# Patient Record
Sex: Female | Born: 1950 | Race: White | Hispanic: No | Marital: Married | Smoking: Former smoker
Health system: Southern US, Community
[De-identification: ages and names within clinical notes are randomized; demographics above are authoritative.]

## PROBLEM LIST (undated history)

## (undated) DIAGNOSIS — F419 Anxiety disorder, unspecified: Secondary | ICD-10-CM

## (undated) DIAGNOSIS — S83209A Unspecified tear of unspecified meniscus, current injury, unspecified knee, initial encounter: Secondary | ICD-10-CM

## (undated) HISTORY — PX: BREAST REDUCTION SURGERY: SHX8

## (undated) HISTORY — PX: TONSILLECTOMY: SUR1361

## (undated) HISTORY — PX: OTHER SURGICAL HISTORY: SHX169

## (undated) HISTORY — PX: BONE FLAP RECONSTRUCTION SKULL: SHX5176

---

## 1998-02-13 ENCOUNTER — Other Ambulatory Visit: Admission: RE | Admit: 1998-02-13 | Discharge: 1998-02-13 | Payer: Self-pay | Admitting: Obstetrics & Gynecology

## 1999-02-17 ENCOUNTER — Other Ambulatory Visit: Admission: RE | Admit: 1999-02-17 | Discharge: 1999-02-17 | Payer: Self-pay | Admitting: Obstetrics & Gynecology

## 1999-02-18 ENCOUNTER — Other Ambulatory Visit: Admission: RE | Admit: 1999-02-18 | Discharge: 1999-02-18 | Payer: Self-pay | Admitting: Obstetrics & Gynecology

## 1999-02-21 ENCOUNTER — Inpatient Hospital Stay (HOSPITAL_COMMUNITY): Admission: AD | Admit: 1999-02-21 | Discharge: 1999-02-21 | Payer: Self-pay | Admitting: Obstetrics & Gynecology

## 2000-02-18 ENCOUNTER — Other Ambulatory Visit: Admission: RE | Admit: 2000-02-18 | Discharge: 2000-02-18 | Payer: Self-pay | Admitting: Obstetrics & Gynecology

## 2000-03-03 ENCOUNTER — Other Ambulatory Visit: Admission: RE | Admit: 2000-03-03 | Discharge: 2000-03-03 | Payer: Self-pay | Admitting: Plastic Surgery

## 2001-02-03 ENCOUNTER — Other Ambulatory Visit: Admission: RE | Admit: 2001-02-03 | Discharge: 2001-02-03 | Payer: Self-pay | Admitting: Obstetrics & Gynecology

## 2002-02-26 ENCOUNTER — Other Ambulatory Visit: Admission: RE | Admit: 2002-02-26 | Discharge: 2002-02-26 | Payer: Self-pay | Admitting: Obstetrics & Gynecology

## 2002-08-10 ENCOUNTER — Ambulatory Visit (HOSPITAL_COMMUNITY): Admission: RE | Admit: 2002-08-10 | Discharge: 2002-08-10 | Payer: Self-pay | Admitting: Gastroenterology

## 2003-03-04 ENCOUNTER — Other Ambulatory Visit: Admission: RE | Admit: 2003-03-04 | Discharge: 2003-03-04 | Payer: Self-pay | Admitting: Obstetrics & Gynecology

## 2004-03-18 ENCOUNTER — Other Ambulatory Visit: Admission: RE | Admit: 2004-03-18 | Discharge: 2004-03-18 | Payer: Self-pay | Admitting: Obstetrics & Gynecology

## 2005-03-29 ENCOUNTER — Other Ambulatory Visit: Admission: RE | Admit: 2005-03-29 | Discharge: 2005-03-29 | Payer: Self-pay | Admitting: Obstetrics & Gynecology

## 2009-04-28 ENCOUNTER — Observation Stay (HOSPITAL_COMMUNITY): Admission: EM | Admit: 2009-04-28 | Discharge: 2009-05-01 | Payer: Self-pay | Admitting: Emergency Medicine

## 2010-12-27 LAB — URINALYSIS, ROUTINE W REFLEX MICROSCOPIC
Glucose, UA: NEGATIVE mg/dL
Ketones, ur: 15 mg/dL — AB
Nitrite: NEGATIVE
pH: 5.5 (ref 5.0–8.0)

## 2010-12-27 LAB — BASIC METABOLIC PANEL
BUN: 11 mg/dL (ref 6–23)
Calcium: 9.1 mg/dL (ref 8.4–10.5)
Chloride: 106 mEq/L (ref 96–112)
GFR calc Af Amer: >60 mL/min (ref >60)
GFR calc non Af Amer: >60 mL/min (ref >60)
Glucose, Bld: 145 mg/dL — ABNORMAL HIGH (ref 70–99)
Potassium: 4.1 mEq/L (ref 3.5–5.1)
Sodium: 134 mEq/L — ABNORMAL LOW (ref 135–145)
Sodium: 138 mEq/L (ref 135–145)

## 2010-12-27 LAB — CBC
HCT: 37.9 % (ref 36.0–46.0)
Hemoglobin: 12.9 g/dL (ref 12.0–15.0)
Hemoglobin: 16.1 g/dL — ABNORMAL HIGH (ref 12.0–15.0)
MCV: 92.2 fL (ref 78.0–100.0)
Platelets: 222 10*3/uL (ref 150–400)
RBC: 5 MIL/uL (ref 3.87–5.11)
WBC: 5 10*3/uL (ref 4.0–10.5)
WBC: 5.1 10*3/uL (ref 4.0–10.5)

## 2010-12-27 LAB — DIFFERENTIAL
Lymphocytes Relative: 21 % (ref 12–46)
Monocytes Absolute: 0.1 10*3/uL (ref 0.1–1.0)
Monocytes Relative: 3 % (ref 3–12)
Neutro Abs: 3.8 10*3/uL (ref 1.7–7.7)

## 2010-12-27 LAB — URINE MICROSCOPIC-ADD ON

## 2011-02-02 NOTE — Discharge Summary (Signed)
Linda Braun, Linda Braun               ACCOUNT NO.:  [id number]   MEDICAL RECORD NO.:  [id number]          PATIENT TYPE:  OBV   LOCATION:  3041                         FACILITY:  [facility]   PHYSICIAN:  Hillery Aldo, M.D.   DATE OF BIRTH:  Oct 03, 1950   DATE OF ADMISSION:  04/28/2009  DATE OF DISCHARGE:  05/01/2009                               DISCHARGE SUMMARY   PRIMARY CARE PHYSICIAN:  Erskine Speed, MD   DISCHARGE DIAGNOSES:  1. Anaphylactic reaction with hypotension.  2. Hives/urticaria secondary to an adverse reaction to either Cipro or      Macrobid.  3. Dehydration with orthostasis.  4. Anxiety.  5. Constipation.  6. Gastroesophageal reflux disease.   DISCHARGE MEDICATIONS:  1. Prednisone 60 mg on May 01, 2009, taper by 10 mg daily until      off.  2. Pepcid 20 mg p.o. q.12 h. x1 week.  3. Klonopin 0.25 mg p.o. nightly p.r.n.  4. Prilosec 20 mg daily p.o. p.r.n. dyspepsia.  5. Zyrtec 10 mg p.o. daily x1 week.  6. Epipen use as directed for current allergic symptoms involving      swelling about the mouth or throat.   CONSULTATIONS:  None.   BRIEF ADMISSION HISTORY OF PRESENT ILLNESS:  The patient is a 60-year-  old female who was recently treated for urinary tract infection with a 7-  day course of Macrobid followed by 2 doses of Cipro after her symptoms  failed to resolve.  Immediately after this, the patient began to break  out in a rash.  She visited an out-of-town urgent care center and was  given a tapering dose of prednisone as well as some Benadryl.  Symptoms  persisted and she subsequently presented to her primary care physician's  office for evaluation.  While at her physician's office, she developed  lightheadedness and was noted to be hypotensive and therefore sent to  the hospital for further evaluation and treatment.  For full details,  please see the dictated report done by Dr. Ardyth Harps.   PROCEDURES AND DIAGNOSTIC STUDIES:  None.   DISCHARGE  LABORATORY VALUES:  Chemistries were unremarkable with the  exception of mildly elevated glucose of 145.  CBC was completely  unremarkable.  Urinalysis was benign.   HOSPITAL COURSE:  1. Hives/urticaria secondary to an adverse reaction to either Cipro      and Macrobid:  The patient was put on high-dose IV Solu-Medrol,      Pepcid, and Benadryl.  She had significant anxiety regarding her      skin symptoms and had to be reassured by the nursing staff as well      as her treating physicians that this was a temporary condition.  At      this point, the patient's hives are dramatically better and her      steroids have been tapered.  She will be discharged on an      additional 6 days of treatment with prednisone on a tapering dose.  2. Dehydration/orthostasis:  The patient received IV fluids and her      blood pressure  has been stable with no recurrent symptoms.  3. Anxiety:  The patient was provided with Klonopin as needed.  4. Constipation:  The patient responded to MiraLax.  5. Gastroesophageal reflux disease:  The patient can continue Prilosec      OTC as needed.   DISPOSITION:  The patient is medically stable and will be discharged  home.  She has been written a prescription for an Epipen so that if she  develops an allergic reaction in the future, she can self administer  epinephrine for any development of angioedema or throat swelling.   CONDITION ON DISCHARGE:  Improved.   Time spent in coordinating care for discharge and discharge instructions  equals to 35 minutes.      Hillery Aldo, M.D.  Electronically Signed     CR/MEDQ  D:  05/01/2009  T:  05/02/2009  Job:  [id number]   cc:   Erskine Speed, M.D.

## 2011-02-02 NOTE — H&P (Signed)
NAME:  Braun, Linda               ACCOUNT NO.:  [id number]   MEDICAL RECORD NO.:  [id number]          PATIENT TYPE:  EMS   LOCATION:  MAJO                         FACILITY:  [facility]   PHYSICIAN:  Peggye Pitt, M.D. DATE OF BIRTH:  27-Dec-1950   DATE OF ADMISSION:  04/28/2009  DATE OF DISCHARGE:                              HISTORY & PHYSICAL   PRIMARY CARE PHYSICIAN:  Dr. Elmore Guise   CHIEF COMPLAINT:  Hives/lightheadedness.   HISTORY OF PRESENT ILLNESS:  Linda Braun is a very pleasant, 60-year-  old Caucasian lady who has no significant past medical history, who on a  July 26th was diagnosed with a urinary tract infection and given a 7-day  course of Macrobid which she completed without any problems.  However,  because of continued symptoms, she was phoned in a prescription for  Cipro of which she took a total of 2 pills.  Immediately afterwards, she  started breaking out in a rash and had some swollen joints.  Meanwhile,  she had been to [facility] to visit her daughter, so she went to an urgent  care center there and was given a tapering dose of prednisone as well as  some Benadryl.  However, symptoms persisted for which she went to Dr.  Thomasene Lot office today.  While at the office, she was a lightheaded and  had signs of orthostasis, and she was told to come into the hospital for  further evaluation and management.  Linda Braun has not had a swollen  tongue or lips.  She has not had any difficulty swallowing or breathing.  No fevers or chills.  No abdominal pain.   ALLERGIES:  Now allergic to CIPRO; otherwise unknown.   PAST MEDICAL HISTORY:  Nonsignificant; acoustic neuroma.   Her home medications include:  Klonopin 2.5 to 5 mg at bedtime as  needed.   SOCIAL HISTORY:  She denies any illicit drug use or tobacco abuse.  However, she does drink 2 glasses of wine maybe 4 times a week.   FAMILY HISTORY:  She is married her husband, Cleone Slim, is present at time of  examination.   FAMILY HISTORY:  Nonsignificant.   REVIEW OF SYSTEMS:  Negative except as already mentioned in HPI.   PHYSICAL EXAM:  VITAL SIGNS: Upon admission blood pressure 130/79, heart  rate 116, respirations 16, O2 sats 99% on room air with a temperature of  99.1.  GENERAL:  She is currently alert, awake, oriented x3, is very drowsy.  HEENT:  Normocephalic, atraumatic.  Her pupils are equally reactive to  light and accommodation with intact extraocular movements.  She has  extremely dry mucous membranes.  CARDIOVASCULAR:  She is tachycardiac with a regular rhythm.  No murmurs,  rubs, or gallops were auscultated.  LUNGS:  Clear to auscultation bilaterally.  ABDOMEN:  Soft, nontender, nondistended, with positive bowel sounds.  EXTREMITIES:  She has some trace bilateral pitting edema with no  clubbing or cyanosis.  NEUROLOGIC:  Exam is grossly intact and nonfocal.  SKIN:  She has diffuse whelps and erythema all over her trunk, face,  eyelids, arms, and thighs.  The only area on her body that seems to be  spared are her calves and feet.   LABS UPON ADMISSION:  Sodium 134, potassium 3.9, chloride 101, bicarb  26, BUN 12, creatinine 0.73 with a glucose of 157.  WBCs 5.0, hemoglobin  16.1, and a platelet count of 274.   ASSESSMENT AND PLAN:  1. Allergic reaction to Cipro.  Currently, she does not to appear to      have any airway compromise.  We will admit to telemetry for 23-hour      observation.  We will start her on IV steroids, H1 and H2 blockers.      I will also give her topical Benadryl as needed for comfort.  2. Dehydration as evidenced by her poor skin turgor and decreased      mucous membranes as well as by her hemoconcentration with a      hemoglobin of 16.11.  We will start her on IV fluids at 100 mL an      hour and check orthostatic vital signs.  Per Dr. Thomasene Lot note, she      was orthostatic in his office; however I do not have the      orthostatic vital signs with me at this  time.  3. Prophylaxis.  She will be on Lovenox for DVT prophylaxis and on      Pepcid for GI prophylaxis.      Peggye Pitt, M.D.  Electronically Signed     EH/MEDQ  D:  04/28/2009  T:  04/28/2009  Job:  [id number]

## 2011-05-17 ENCOUNTER — Inpatient Hospital Stay (HOSPITAL_COMMUNITY): Admit: 2011-05-17 | Payer: Self-pay | Admitting: Orthopedic Surgery

## 2011-10-15 ENCOUNTER — Encounter: Payer: Self-pay | Admitting: Gastroenterology

## 2011-11-05 ENCOUNTER — Encounter: Payer: Self-pay | Admitting: Gastroenterology

## 2013-07-20 ENCOUNTER — Ambulatory Visit
Admission: RE | Admit: 2013-07-20 | Discharge: 2013-07-20 | Disposition: A | Discharge status: Home / Self Care | Payer: BC Managed Care – PPO | Source: Ambulatory Visit | Attending: Internal Medicine | Admitting: Internal Medicine

## 2013-07-20 ENCOUNTER — Other Ambulatory Visit: Payer: Self-pay | Admitting: Internal Medicine

## 2013-07-20 DIAGNOSIS — W19XXXA Unspecified fall, initial encounter: Secondary | ICD-10-CM

## 2013-09-17 ENCOUNTER — Other Ambulatory Visit: Payer: Self-pay | Admitting: Orthopedic Surgery

## 2013-09-21 ENCOUNTER — Encounter (HOSPITAL_COMMUNITY): Payer: Self-pay | Admitting: Pharmacy Technician

## 2013-09-27 ENCOUNTER — Encounter (HOSPITAL_COMMUNITY)
Admission: RE | Admit: 2013-09-27 | Discharge: 2013-09-27 | Disposition: A | Discharge status: Home / Self Care | Payer: BC Managed Care – PPO | Source: Ambulatory Visit | Attending: Orthopedic Surgery | Admitting: Orthopedic Surgery

## 2013-09-27 ENCOUNTER — Encounter (HOSPITAL_COMMUNITY): Payer: Self-pay

## 2013-09-27 DIAGNOSIS — Z01812 Encounter for preprocedural laboratory examination: Secondary | ICD-10-CM | POA: Insufficient documentation

## 2013-09-27 HISTORY — DX: Unspecified tear of unspecified meniscus, current injury, unspecified knee, initial encounter: S83.209A

## 2013-09-27 HISTORY — DX: Anxiety disorder, unspecified: F41.9

## 2013-09-27 LAB — CBC
HCT: 37.1 % (ref 36.0–46.0)
HEMOGLOBIN: 12.9 g/dL (ref 12.0–15.0)
MCH: 30.6 pg (ref 26.0–34.0)
MCHC: 34.8 g/dL (ref 30.0–36.0)
MCV: 88.1 fL (ref 78.0–100.0)
PLATELETS: 296 10*3/uL (ref 150–400)
RBC: 4.21 MIL/uL (ref 3.87–5.11)
RDW: 12.5 % (ref 11.5–15.5)
WBC: 6.5 10*3/uL (ref 4.0–10.5)

## 2013-09-27 NOTE — Patient Instructions (Addendum)
Linda ApleySusan L Braun  09/27/2013                           YOUR PROCEDURE IS SCHEDULED ON: 10/03/13               PLEASE REPORT TO SHORT STAY CENTER AT : 7:30 am               CALL THIS NUMBER IF ANY PROBLEMS THE DAY OF SURGERY :               [phone]                      REMEMBER:   Do not eat food or drink liquids AFTER MIDNIGHT   Take these medicines the morning of surgery with A SIP OF WATER:  Clonazepam if needed   Do not wear jewelry, make-up   Do not wear lotions, powders, or perfumes.   Do not shave legs or underarms 12 hrs. before surgery (men may shave face)  Do not bring valuables to the hospital.  Contacts, dentures or bridgework may not be worn into surgery.  Leave suitcase in the car. After surgery it may be brought to your room.  For patients admitted to the hospital more than one night, checkout time is 11:00                          The day of discharge.   Patients discharged the day of surgery will not be allowed to drive home                             If going home same day of surgery, must have someone stay with you first                           24 hrs at home and arrange for some one to drive you home from hospital.    Special Instructions:   Please read over the following fact sheets that you were given:                                  1. [place] PREPARING FOR SURGERY SHEET                                                X_____________________________________________________________________        Failure to follow these instructions may result in cancellation of your surgery

## 2013-10-02 ENCOUNTER — Other Ambulatory Visit: Payer: Self-pay | Admitting: Orthopedic Surgery

## 2013-10-03 ENCOUNTER — Ambulatory Visit (HOSPITAL_COMMUNITY): Payer: BC Managed Care – PPO | Admitting: Anesthesiology

## 2013-10-03 ENCOUNTER — Encounter (HOSPITAL_COMMUNITY): Payer: BC Managed Care – PPO | Admitting: Anesthesiology

## 2013-10-03 ENCOUNTER — Encounter (HOSPITAL_COMMUNITY)
Admission: RE | Disposition: A | Discharge status: Home / Self Care | Payer: Self-pay | Source: Ambulatory Visit | Attending: Orthopedic Surgery

## 2013-10-03 ENCOUNTER — Encounter (HOSPITAL_COMMUNITY): Payer: Self-pay

## 2013-10-03 ENCOUNTER — Ambulatory Visit (HOSPITAL_COMMUNITY)
Admission: RE | Admit: 2013-10-03 | Discharge: 2013-10-03 | Disposition: A | Discharge status: Home / Self Care | Payer: BC Managed Care – PPO | Source: Ambulatory Visit | Attending: Orthopedic Surgery | Admitting: Orthopedic Surgery

## 2013-10-03 DIAGNOSIS — R296 Repeated falls: Secondary | ICD-10-CM | POA: Insufficient documentation

## 2013-10-03 DIAGNOSIS — X500XXA Overexertion from strenuous movement or load, initial encounter: Secondary | ICD-10-CM | POA: Insufficient documentation

## 2013-10-03 DIAGNOSIS — Y9229 Other specified public building as the place of occurrence of the external cause: Secondary | ICD-10-CM | POA: Insufficient documentation

## 2013-10-03 DIAGNOSIS — S83249A Other tear of medial meniscus, current injury, unspecified knee, initial encounter: Secondary | ICD-10-CM

## 2013-10-03 DIAGNOSIS — IMO0002 Reserved for concepts with insufficient information to code with codable children: Secondary | ICD-10-CM | POA: Insufficient documentation

## 2013-10-03 HISTORY — PX: KNEE ARTHROSCOPY: SHX127

## 2013-10-03 SURGERY — ARTHROSCOPY, KNEE
Anesthesia: General | Site: Knee | Laterality: Right

## 2013-10-03 MED ORDER — HYDROMORPHONE HCL 2 MG PO TABS: 2.0000 mg | ORAL_TABLET | ORAL | Status: DC | PRN

## 2013-10-03 MED ORDER — PROMETHAZINE HCL 25 MG/ML IJ SOLN: 6.2500 mg | INTRAMUSCULAR | Status: DC | PRN

## 2013-10-03 MED ORDER — LIDOCAINE HCL (CARDIAC) 20 MG/ML IV SOLN
INTRAVENOUS | Status: AC
  Filled 2013-10-03: qty 5

## 2013-10-03 MED ORDER — LACTATED RINGERS IV SOLN
INTRAVENOUS | Status: DC
  Administered 2013-10-03: 09:00:00 via INTRAVENOUS
  Administered 2013-10-03: 1000 mL via INTRAVENOUS

## 2013-10-03 MED ORDER — METHOCARBAMOL 500 MG PO TABS: 500.0000 mg | ORAL_TABLET | Freq: Four times a day (QID) | ORAL | Status: DC

## 2013-10-03 MED ORDER — HYDROCODONE-ACETAMINOPHEN 5-325 MG PO TABS: 1.0000 | ORAL_TABLET | Freq: Four times a day (QID) | ORAL | Status: DC | PRN

## 2013-10-03 MED ORDER — FENTANYL CITRATE 0.05 MG/ML IJ SOLN
25.0000 ug | INTRAMUSCULAR | Status: DC | PRN
  Administered 2013-10-03 (×3): 25 ug via INTRAVENOUS

## 2013-10-03 MED ORDER — MIDAZOLAM HCL 5 MG/5ML IJ SOLN
INTRAMUSCULAR | Status: DC | PRN
  Administered 2013-10-03: 2 mg via INTRAVENOUS

## 2013-10-03 MED ORDER — METHOCARBAMOL 500 MG PO TABS: 500.0000 mg | ORAL_TABLET | Freq: Once | ORAL | Status: DC

## 2013-10-03 MED ORDER — METHOCARBAMOL 500 MG PO TABS
500.0000 mg | ORAL_TABLET | Freq: Once | ORAL | Status: AC
  Administered 2013-10-03: 500 mg via ORAL
  Filled 2013-10-03: qty 1

## 2013-10-03 MED ORDER — FENTANYL CITRATE 0.05 MG/ML IJ SOLN
INTRAMUSCULAR | Status: AC
  Filled 2013-10-03: qty 2

## 2013-10-03 MED ORDER — LACTATED RINGERS IR SOLN
Status: DC | PRN
  Administered 2013-10-03: 3000 mL

## 2013-10-03 MED ORDER — DEXAMETHASONE SODIUM PHOSPHATE 10 MG/ML IJ SOLN
10.0000 mg | Freq: Once | INTRAMUSCULAR | Status: AC
  Administered 2013-10-03: 10 mg via INTRAVENOUS

## 2013-10-03 MED ORDER — ACETAMINOPHEN 10 MG/ML IV SOLN
1000.0000 mg | Freq: Once | INTRAVENOUS | Status: AC
  Administered 2013-10-03: 1000 mg via INTRAVENOUS
  Filled 2013-10-03: qty 100

## 2013-10-03 MED ORDER — VANCOMYCIN HCL IN DEXTROSE 1-5 GM/200ML-% IV SOLN
INTRAVENOUS | Status: AC
  Filled 2013-10-03: qty 200

## 2013-10-03 MED ORDER — CHLORHEXIDINE GLUCONATE 4 % EX LIQD: 60.0000 mL | Freq: Once | CUTANEOUS | Status: DC

## 2013-10-03 MED ORDER — SODIUM CHLORIDE 0.9 % IV SOLN: INTRAVENOUS | Status: DC

## 2013-10-03 MED ORDER — HYDROMORPHONE HCL 2 MG PO TABS
2.0000 mg | ORAL_TABLET | ORAL | Status: DC | PRN
  Administered 2013-10-03: 2 mg via ORAL
  Filled 2013-10-03: qty 1

## 2013-10-03 MED ORDER — FENTANYL CITRATE 0.05 MG/ML IJ SOLN
INTRAMUSCULAR | Status: DC | PRN
  Administered 2013-10-03 (×4): 25 ug via INTRAVENOUS

## 2013-10-03 MED ORDER — VANCOMYCIN HCL IN DEXTROSE 1-5 GM/200ML-% IV SOLN
1000.0000 mg | INTRAVENOUS | Status: AC
  Administered 2013-10-03: 1000 mg via INTRAVENOUS

## 2013-10-03 MED ORDER — BUPIVACAINE-EPINEPHRINE 0.25% -1:200000 IJ SOLN
INTRAMUSCULAR | Status: DC | PRN
  Administered 2013-10-03: 20 mL

## 2013-10-03 MED ORDER — LIDOCAINE HCL (CARDIAC) 20 MG/ML IV SOLN
INTRAVENOUS | Status: DC | PRN
  Administered 2013-10-03: 50 mg via INTRAVENOUS

## 2013-10-03 MED ORDER — PROPOFOL 10 MG/ML IV BOLUS
INTRAVENOUS | Status: AC
Start: 2013-10-03 — End: 2013-10-03
  Filled 2013-10-03: qty 20

## 2013-10-03 MED ORDER — MIDAZOLAM HCL 2 MG/2ML IJ SOLN
INTRAMUSCULAR | Status: AC
  Filled 2013-10-03: qty 2

## 2013-10-03 MED ORDER — PROPOFOL 10 MG/ML IV BOLUS
INTRAVENOUS | Status: DC | PRN
  Administered 2013-10-03: 200 mg via INTRAVENOUS

## 2013-10-03 MED ORDER — ONDANSETRON HCL 4 MG/2ML IJ SOLN
INTRAMUSCULAR | Status: DC | PRN
  Administered 2013-10-03: 4 mg via INTRAVENOUS

## 2013-10-03 MED ORDER — BUPIVACAINE-EPINEPHRINE 0.25% -1:200000 IJ SOLN
INTRAMUSCULAR | Status: AC
  Filled 2013-10-03: qty 1

## 2013-10-03 SURGICAL SUPPLY — 26 items
BANDAGE ELASTIC 6 VELCRO ST LF (GAUZE/BANDAGES/DRESSINGS) ×2 IMPLANT
BLADE 4.2CUDA (BLADE) ×2 IMPLANT
CHLORAPREP W/TINT 26ML (MISCELLANEOUS) ×2 IMPLANT
CLOTH BEACON ORANGE TIMEOUT ST (SAFETY) ×2 IMPLANT
COUNTER NEEDLE 20 DBL MAG RED (NEEDLE) ×2 IMPLANT
CUFF TOURN SGL QUICK 34 (TOURNIQUET CUFF) ×1
CUFF TRNQT CYL 34X4X40X1 (TOURNIQUET CUFF) ×1 IMPLANT
DRAPE U-SHAPE 47X51 STRL (DRAPES) ×2 IMPLANT
DRSG EMULSION OIL 3X3 NADH (GAUZE/BANDAGES/DRESSINGS) ×2 IMPLANT
DRSG PAD ABDOMINAL 8X10 ST (GAUZE/BANDAGES/DRESSINGS) ×2 IMPLANT
DURAPREP 26ML APPLICATOR (WOUND CARE) IMPLANT
GLOVE BIO SURGEON STRL SZ8 (GLOVE) ×2 IMPLANT
GLOVE BIOGEL PI IND STRL 8 (GLOVE) ×1 IMPLANT
GLOVE BIOGEL PI INDICATOR 8 (GLOVE) ×1
GOWN STRL REUS W/TWL LRG LVL3 (GOWN DISPOSABLE) ×2 IMPLANT
MANIFOLD NEPTUNE II (INSTRUMENTS) ×2 IMPLANT
PACK ARTHROSCOPY WL (CUSTOM PROCEDURE TRAY) ×2 IMPLANT
PACK ICE MAXI GEL EZY WRAP (MISCELLANEOUS) ×6 IMPLANT
PADDING CAST COTTON 6X4 STRL (CAST SUPPLIES) ×2 IMPLANT
POSITIONER SURGICAL ARM (MISCELLANEOUS) ×2 IMPLANT
SET ARTHROSCOPY TUBING (MISCELLANEOUS) ×1
SET ARTHROSCOPY TUBING LN (MISCELLANEOUS) ×1 IMPLANT
SUT ETHILON 4 0 PS 2 18 (SUTURE) ×2 IMPLANT
TOWEL OR 17X26 10 PK STRL BLUE (TOWEL DISPOSABLE) ×2 IMPLANT
WAND 90 DEG TURBOVAC W/CORD (SURGICAL WAND) IMPLANT
WRAP KNEE MAXI GEL POST OP (GAUZE/BANDAGES/DRESSINGS) ×2 IMPLANT

## 2013-10-03 NOTE — Transfer of Care (Signed)
Immediate Anesthesia Transfer of Care Note  Patient: Linda ApleySusan L Braun  Procedure(s) Performed: Procedure(s): RIGHT ARTHROSCOPY KNEE WITH DEBRIDEMENT (Right)  Patient Location: PACU  Anesthesia Type:General  Level of Consciousness: awake, alert , oriented and patient cooperative  Airway & Oxygen Therapy: Patient Spontanous Breathing and Patient connected to face mask oxygen  Post-op Assessment: Report given to PACU RN and Post -op Vital signs reviewed and stable  Post vital signs: Reviewed and stable  Complications: No apparent anesthesia complications

## 2013-10-03 NOTE — Interval H&P Note (Signed)
History and Physical Interval Note:  10/03/2013 10:15 AM  Linda Braun  has presented today for surgery, with the diagnosis of RIGHT KNEE MEDIAL MENISCAL TEAR  The various methods of treatment have been discussed with the patient and family. After consideration of risks, benefits and other options for treatment, the patient has consented to  Procedure(s): RIGHT ARTHROSCOPY KNEE WITH DEBRIDEMENT (Right) as a surgical intervention .  The patient's history has been reviewed, patient examined, no change in status, stable for surgery.  I have reviewed the patient's chart and labs.  Questions were answered to the patient's satisfaction.     Loanne DrillingALUISIO,Gilmore List V

## 2013-10-03 NOTE — Anesthesia Preprocedure Evaluation (Addendum)
Anesthesia Evaluation  Patient identified by MRN, date of birth, ID band Patient awake    Reviewed: Allergy & Precautions, H&P , NPO status , Patient's Chart, lab work & pertinent test results  Airway Mallampati: II TM Distance: >3 FB Neck ROM: Full    Dental no notable dental hx.    Pulmonary neg pulmonary ROS, former smoker,  breath sounds clear to auscultation  Pulmonary exam normal       Cardiovascular Exercise Tolerance: Good negative cardio ROS  Rhythm:Regular Rate:Normal     Neuro/Psych Anxiety Fullness sensation in the back of her head after her acoustic neuroma craniotomy.    GI/Hepatic negative GI ROS, Neg liver ROS,   Endo/Other  negative endocrine ROS  Renal/GU negative Renal ROS  negative genitourinary   Musculoskeletal negative musculoskeletal ROS (+)   Abdominal   Peds negative pediatric ROS (+)  Hematology negative hematology ROS (+)   Anesthesia Other Findings   Reproductive/Obstetrics negative OB ROS                          Anesthesia Physical Anesthesia Plan  ASA: II  Anesthesia Plan: General   Post-op Pain Management:    Induction: Intravenous  Airway Management Planned: LMA  Additional Equipment:   Intra-op Plan:   Post-operative Plan: Extubation in OR  Informed Consent: I have reviewed the patients History and Physical, chart, labs and discussed the procedure including the risks, benefits and alternatives for the proposed anesthesia with the patient or authorized representative who has indicated his/her understanding and acceptance.   Dental advisory given  Plan Discussed with: CRNA  Anesthesia Plan Comments:         Anesthesia Quick Evaluation

## 2013-10-03 NOTE — Discharge Instructions (Signed)
Arthroscopic Procedure, Knee °An arthroscopic procedure can find what is wrong with your knee. °PROCEDURE °Arthroscopy is a surgical technique that allows your orthopedic surgeon to diagnose and treat your knee injury with accuracy. They will look into your knee through a small instrument. This is almost like a small (pencil sized) telescope. Because arthroscopy affects your knee less than open knee surgery, you can anticipate a more rapid recovery. Taking an active role by following your caregiver's instructions will help with rapid and complete recovery. Use crutches, rest, elevation, ice, and knee exercises as instructed. The length of recovery depends on various factors including type of injury, age, physical condition, medical conditions, and your rehabilitation. °Your knee is the joint between the large bones (femur and tibia) in your leg. Cartilage covers these bone ends which are smooth and slippery and allow your knee to bend and move smoothly. Two menisci, thick, semi-lunar shaped pads of cartilage which form a rim inside the joint, help absorb shock and stabilize your knee. Ligaments bind the bones together and support your knee joint. Muscles move the joint, help support your knee, and take stress off the joint itself. Because of this all programs and physical therapy to rehabilitate an injured or repaired knee require rebuilding and strengthening your muscles. °AFTER THE PROCEDURE °· After the procedure, you will be moved to a recovery area until most of the effects of the medication have worn off. Your caregiver will discuss the test results with you.  °· Only take over-the-counter or prescription medicines for pain, discomfort, or fever as directed by your caregiver.  °SEEK MEDICAL CARE IF:  °· You have increased bleeding from your wounds.  °· You see redness, swelling, or have increasing pain in your wounds.  °· You have pus coming from your wound.  °· You have an oral temperature above 102° F (38.9°  C).  °· You notice a bad smell coming from the wound or dressing.  °· You have severe pain with any motion of your knee.  °SEEK IMMEDIATE MEDICAL CARE IF:  °· You develop a rash.  °· You have difficulty breathing.  °· You have any allergic problems.  °FURTHER INSTRUCTIONS: °· You may start showering two days after being discharged home but do not submerge the incisions under water.  °· Change dressing 48 hours after the procedure and then cover the small incisions with band aids until your follow up visit. °· Avoid periods of inactivity such as sitting longer than an hour when not asleep. This helps prevent blood clots.  °· You may put full weight on your legs and walk as much as is comfortable.  °· Do not drive while taking narcotics.  °Wear the elastic stockings for three weeks following surgery during the day but you may remove then at night. °· Make sure you keep all of your appointments after your operation with all of your doctors and caregivers. You should call the office at [phone] and make an appointment for approximately one week after the date of your surgery. °· Please pick up a stool softener and laxative for home use as long as you are requiring pain medications. °· Continue to use ice on the knee for pain and swelling from surgery. You may notice swelling that will progress down to the foot and ankle.  This is normal after surgery.  Elevate the leg when you are not up walking on it.   °RANGE OF MOTION AND STRENGTHENING EXERCISES  °Rehabilitation of the knee is   important following a knee injury or an operation. After just a few days of immobilization, the muscles of the thigh which control the knee become weakened and shrink (atrophy). Knee exercises are designed to build up the tone and strength of the thigh muscles and to improve knee motion. Often times heat used for twenty to thirty minutes before working out will loosen up your tissues and help with improving the range of motion but do not  use heat for the first two weeks following surgery. These exercises can be done on a training (exercise) mat, on the floor, on a table or on a bed. Use what ever works the best and is most comfortable for you Knee exercises include: ° ° ° ° ° ° °QUAD STRENGTHENING EXERCISES °Strengthening Quadriceps Sets ° °Tighten muscles on top of thigh by pushing knees down into floor or table. °Hold for 20 seconds. Repeat 10 times. °Do 2 sessions per day. ° ° ° °Strengthening Terminal Knee Extension ° °With knee bent over bolster, straighten knee by tightening muscle on top of thigh. Be sure to keep bottom of knee on bolster. °Hold for 20 seconds. Repeat 10 times. °Do 2 sessions per day. ° ° °Straight Leg with Bent Knee ° °Lie on back with opposite leg bent. Keep involved knee slightly bent at knee and raise leg 4-6". Hold for 10 seconds. °Repeat 20 times per set. °Do 2 sets per session. °Do 2 sessions per day. ° °

## 2013-10-03 NOTE — H&P (Signed)
  CC- Linda Braun is a 63 y.o. female who presents with right knee pain.  HPI- . Knee Pain: Patient presents with knee pain involving the  right knee. Onset of the symptoms was several months ago. Inciting event: fell getting on to a hotel elevator with twisting of right knee. Current symptoms include giving out, locking, pain located medially and stiffness. Pain is aggravated by lateral movements, pivoting, rising after sitting and squatting.  Patient has had no prior knee problems. Evaluation to date: MRI: abnormal medial memiscal tear. Treatment to date: rest.  Past Medical History  Diagnosis Date  . Meniscus tear     rt knee  . Anxiety     Past Surgical History  Procedure Laterality Date  . Cesarean section      x2  . Breast reduction surgery    . Tonsillectomy    . Acustic neuroma's removed    . Bone flap reconstruction skull      titanium plate    Prior to Admission medications   Medication Sig Start Date End Date Taking? Authorizing Provider  acetaminophen (TYLENOL) 500 MG tablet Take 1,000 mg by mouth every 6 (six) hours as needed for mild pain or moderate pain.   Yes Historical Provider, MD  Calcium Carbonate-Vitamin D (CALCIUM + D PO) Take 1 tablet by mouth daily.   Yes Historical Provider, MD  clonazePAM (KLONOPIN) 0.25 MG disintegrating tablet Take 0.25 mg by mouth 2 (two) times daily as needed (for anxiety).    Yes Historical Provider, MD  Multiple Vitamins-Minerals (MULTIVITAMIN WITH MINERALS) tablet Take 1 tablet by mouth daily.   Yes Historical Provider, MD   KNEE EXAM antalgic gait, soft tissue tenderness over medial joint line, no effusion, negative drawer sign, collateral ligaments intact  Physical Examination: General appearance - alert, well appearing, and in no distress Mental status - alert, oriented to person, place, and time Chest - clear to auscultation, no wheezes, rales or rhonchi, symmetric air entry Heart - normal rate, regular rhythm, normal S1,  S2, no murmurs, rubs, clicks or gallops Abdomen - soft, nontender, nondistended, no masses or organomegaly Neurological - alert, oriented, normal speech, no focal findings or movement disorder noted   Asessment/Plan--Right knee medial meniscal tear- - Plan Right knee arthroscopy with meniscal debridement. Procedure risks and potential comps discussed with patient who elects to proceed. Goals are decreased pain and increased function with a high likelihood of achieving both

## 2013-10-03 NOTE — Anesthesia Postprocedure Evaluation (Signed)
  Anesthesia Post-op Note  Patient: Linda Braun  Procedure(s) Performed: Procedure(s) (LRB): RIGHT ARTHROSCOPY KNEE WITH DEBRIDEMENT (Right)  Patient Location: PACU  Anesthesia Type: General  Level of Consciousness: awake and alert   Airway and Oxygen Therapy: Patient Spontanous Breathing  Post-op Pain: mild  Post-op Assessment: Post-op Vital signs reviewed, Patient's Cardiovascular Status Stable, Respiratory Function Stable, Patent Airway and No signs of Nausea or vomiting  Last Vitals:  Filed Vitals:   10/03/13 1407  BP: 147/71  Pulse: 67  Temp:   Resp: 16    Post-op Vital Signs: stable   Complications: No apparent anesthesia complications

## 2013-10-03 NOTE — Op Note (Signed)
Preoperative diagnosis-  Right knee medial meniscal tear  Postoperative diagnosis Right- knee medial meniscal tear   Procedure- Right knee arthroscopy with medial meniscal debridement    Surgeon- Gus RankinFrank V. Fredia Chittenden, MD  Anesthesia-General  EBL-  Minimal  Complications- None  Condition- PACU - hemodynamically stable.  Brief clinical note- -Linda ApleySusan L Braun is a 63 y.o.  female with a several month history of right knee pain and mechanical symptoms. Exam and history suggested medial meniscal tear confirmed by MRI. The patient presents now for arthroscopy and debridement   Procedure in detail -       After successful administration of General anesthetic, a tourmiquet is placed high on the Right  thigh and the Right lower extremity is prepped and draped in the usual sterile fashion. Time out is performed by the surgical team. Standard superomedial and inferolateral portal sites are marked and incisions made with an 11 blade. The inflow cannula is passed through the superomedial portal and camera through the inferolateral portal and inflow is initiated. Arthroscopic visualization proceeds.      The undersurface of the patella and trochlea are visualized and are normal. The medial and lateral gutters are visualized and there are  no loose bodies. Flexion and valgus force is applied to the knee and the medial compartment is entered. A spinal needle is passed into the joint through the site marked for the inferomedial portal. A small incision is made and the dilator passed into the joint. The findings for the medial compartment are bucket handle tear medial meniscus with 2 displaced fragments- one in the intercondylar notch and one in the medial compartment . The displaced fragments are detached with an arthroscopic scissor and removed with the pituitary grasper. The remainder of the tear is debrided to a stable base with baskets and a shaver and sealed off with the Arthrocare. The shaver is used to  debride the unstable cartilage to a stable cartilaginous base with stable edges. It is probed and found to be stable.    The intercondylar notch is visualized and the ACL appears normal. The lateral compartment is entered and the findings are normal .       The joint is again inspected and there are no other tears, defects or loose bodies identified. The arthroscopic equipment is then removed from the inferior portals which are closed with interrupted 4-0 nylon. 20 ml of .25% Marcaine with epinephrine are injected through the inflow cannula and the cannula is then removed and the portal closed with nylon. The incisions are cleaned and dried and a bulky sterile dressing is applied. The patient is then awakened and transported to recovery in stable condition.   10/03/2013, 11:17 AM

## 2013-10-03 NOTE — Progress Notes (Signed)
Patient states that she has had a rash and itching right nipple a couple weeks ago. Has been using OTC creams w relief. States nipple again itching w rash after using Hibiclens showers x 2 preop. No rash. No itching noted elsewhere. Encouraged to use OTC creams again but if little or no relief to see GYN for evaluation.

## 2013-10-04 ENCOUNTER — Encounter (HOSPITAL_COMMUNITY): Payer: Self-pay | Admitting: Orthopedic Surgery

## 2014-12-24 ENCOUNTER — Ambulatory Visit
Admission: RE | Admit: 2014-12-24 | Discharge: 2014-12-24 | Disposition: A | Discharge status: Home / Self Care | Payer: Self-pay | Source: Ambulatory Visit | Attending: Internal Medicine | Admitting: Internal Medicine

## 2014-12-24 ENCOUNTER — Other Ambulatory Visit: Payer: Self-pay | Admitting: Internal Medicine

## 2014-12-24 DIAGNOSIS — R0989 Other specified symptoms and signs involving the circulatory and respiratory systems: Secondary | ICD-10-CM

## 2014-12-24 DIAGNOSIS — R059 Cough, unspecified: Secondary | ICD-10-CM

## 2014-12-24 DIAGNOSIS — R05 Cough: Secondary | ICD-10-CM

## 2016-02-13 ENCOUNTER — Other Ambulatory Visit: Payer: Self-pay | Admitting: Orthopedic Surgery

## 2016-02-13 DIAGNOSIS — M1711 Unilateral primary osteoarthritis, right knee: Secondary | ICD-10-CM

## 2016-03-02 ENCOUNTER — Ambulatory Visit
Admission: RE | Admit: 2016-03-02 | Discharge: 2016-03-02 | Disposition: A | Discharge status: Home / Self Care | Payer: BLUE CROSS/BLUE SHIELD | Source: Ambulatory Visit | Attending: Orthopedic Surgery | Admitting: Orthopedic Surgery

## 2016-03-02 DIAGNOSIS — M1711 Unilateral primary osteoarthritis, right knee: Secondary | ICD-10-CM

## 2016-04-07 ENCOUNTER — Other Ambulatory Visit: Payer: Self-pay | Admitting: Internal Medicine

## 2016-04-07 DIAGNOSIS — R1032 Left lower quadrant pain: Secondary | ICD-10-CM

## 2016-04-12 ENCOUNTER — Ambulatory Visit
Admission: RE | Admit: 2016-04-12 | Discharge: 2016-04-12 | Disposition: A | Discharge status: Home / Self Care | Payer: BLUE CROSS/BLUE SHIELD | Source: Ambulatory Visit | Attending: Internal Medicine | Admitting: Internal Medicine

## 2016-04-12 DIAGNOSIS — R1032 Left lower quadrant pain: Secondary | ICD-10-CM

## 2016-04-12 MED ORDER — IOPAMIDOL (ISOVUE-300) INJECTION 61%
100.0000 mL | Freq: Once | INTRAVENOUS | Status: AC | PRN
  Administered 2016-04-12: 100 mL via INTRAVENOUS

## 2017-06-07 ENCOUNTER — Other Ambulatory Visit (HOSPITAL_COMMUNITY): Payer: Self-pay | Admitting: Internal Medicine

## 2017-06-07 DIAGNOSIS — R079 Chest pain, unspecified: Secondary | ICD-10-CM

## 2017-06-09 ENCOUNTER — Ambulatory Visit (HOSPITAL_COMMUNITY)
Admission: RE | Admit: 2017-06-09 | Discharge: 2017-06-09 | Disposition: A | Discharge status: Home / Self Care | Payer: Medicare Other | Source: Ambulatory Visit | Attending: Cardiology | Admitting: Cardiology

## 2017-06-09 DIAGNOSIS — R079 Chest pain, unspecified: Secondary | ICD-10-CM

## 2017-07-17 IMAGING — CT CT HIP*R* W/O CM
1 of 6 series · 4 of 14 positions shown, 5 images · non-contrast
Comparison: 07/20/2013 radiographs

CLINICAL DATA: Primary osteoarthritis of the right knee. Prior
injury in 8053. Conform[facility] protocol.

EXAM:
CT OF THE RIGHT HIP WITHOUT CONTRAST (LIMITED)
CT OF THE RIGHT KNEE WITHOUT CONTRAST
CT OF THE RIGHT ANKLE WITHOUT CONTRAST (LIMITED)
TECHNIQUE: Thin-section axial CT images through the right hip were obtained.
Multidetector CT imaging of the right knee was performed according
to the [facility]form[facility] protocol. Multiplanar CT image reconstructions were
also generated.
Thin-section axial CT images through the right ankle were obtained.

[Series 3: knee bone · axial · 0.35mm/px · z∈[-615,-429]mm · 4 of 496 slices shown, 5 images]
[im 100/496  soft-tissue]
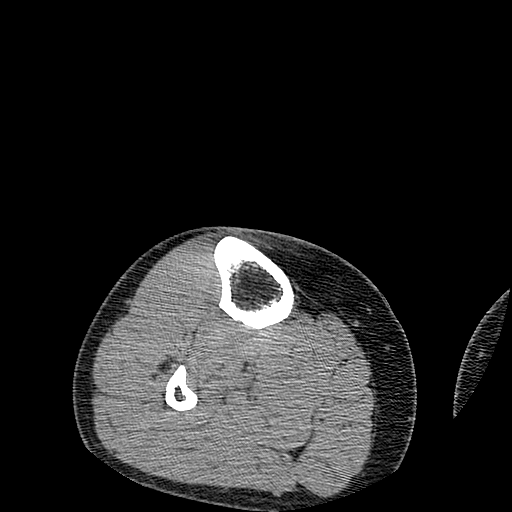
[im 100/496  bone]
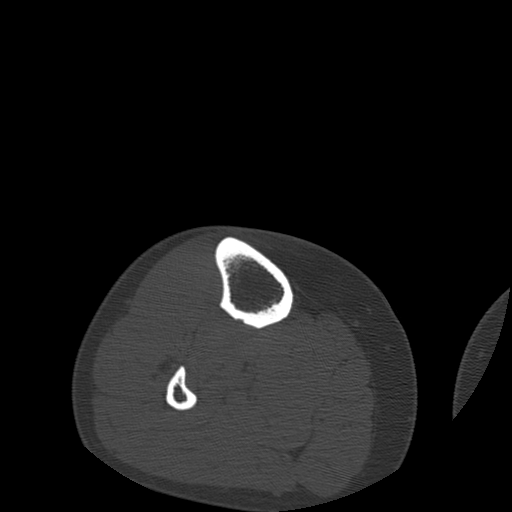
[im 199/496  bone]
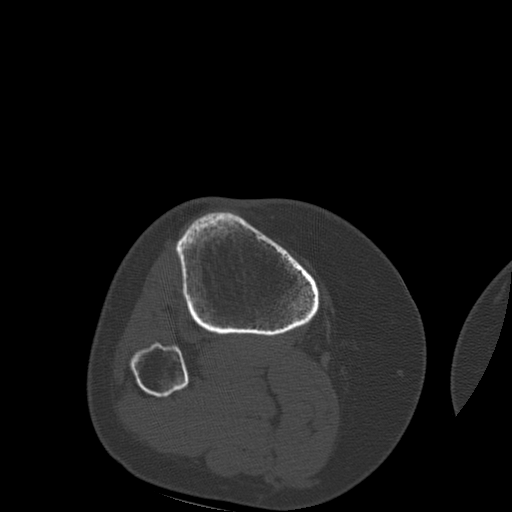
[im 298/496  bone]
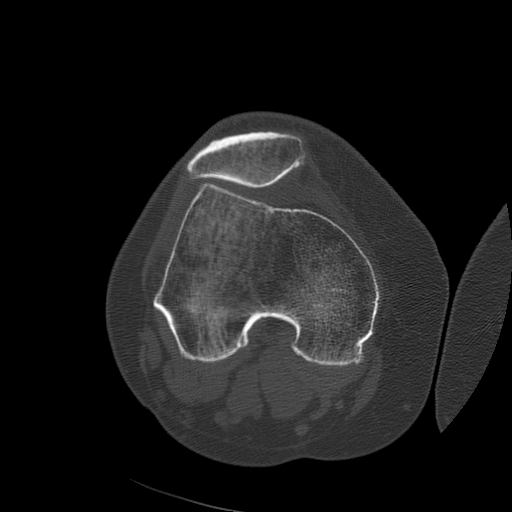
[im 397/496  bone]
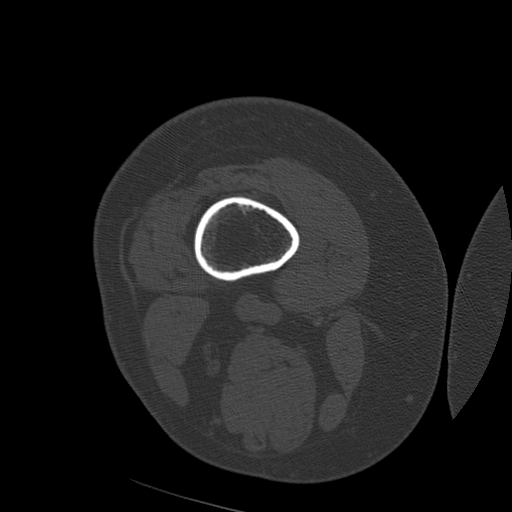

[4 of 14 positions shown; findings below may reference images not displayed]

FINDINGS: RIGHT HIP: There is mild degenerative spurring of the acetabulum.
Mild degenerative subcortical cyst formation present in the anterior
superior acetabulum. No CT findings of avascular necrosis. No other
significant regional abnormality is identified.

RIGHT ANKLE: Small type 1 accessory navicular. There is a suggestion
of common peroneus tendon sheath tenosynovitis. Question of small
amount of gas density posterior to the posterior talofibular
ligament, uncertain significance. Presumably this could be any tiny
venous structure due to a iatrogenic venous gas which is usually
clinically inconsequential. There is some trace edema in Kager's fat
pad. No significant osteochondral lesion of the talar dome or
plafond.

RIGHT KNEE: Severe osteoarthritis of the medial and lateral
compartments with prominent articular space narrowing especially in
the medial compartment. Subcortical eburnation observed in the
medial compartment with marginal spurring and multiple degenerative
subcortical cystic lesions, as on image 67 series 301. Marginal
spurring especially notable in the medial but also the lateral
compartment, with spurring along the tibial spine. Small amount of
nitrogen gas phenomenon in the lateral compartment L lining the
underlying severity of the chondral thinning in the lateral
compartment.

The severity of chondral thinning is slightly less in the
patellofemoral joint, with moderate to prominent lateral
degenerative chondral thinning observed, and mild marginal spurring
in the patella especially laterally and inferiorly. There is very
slight lateral patellar tilt.

No bone infarct or other bony lesion identified.

Trace knee effusion. Tibial tubercle -trochlear groove distance
cm. No overt discontinuity of the collateral ligaments identified.
IMPRESSION: 1. Severe osteoarthritis of the knee, most severely involving the
medial compartment although also with considerable involvement of
the lateral compartment and patellofemoral joint. Trace knee
effusion.
2. Mild degeneration in the anterior superior acetabulum.
3. Suspected common peroneus tendon sheath tenosynovitis in the
ankle. There is trace edema in Kager's fat pad in the ankle,
uncertain significance.

## 2019-10-04 ENCOUNTER — Ambulatory Visit: Payer: Medicare Other | Attending: Internal Medicine

## 2019-10-04 DIAGNOSIS — Z23 Encounter for immunization: Secondary | ICD-10-CM | POA: Insufficient documentation

## 2019-10-04 NOTE — Progress Notes (Signed)
   Covid-19 Vaccination Clinic  Name:  AVANTHIKA DEHNERT    MRN: [id number] DOB: [birth year]  10/04/2019  Ms. Haltiwanger was observed post Covid-19 immunization for 15 minutes without incidence. She was provided with Vaccine Information Sheet and instruction to access the V-Safe system.   Ms. Giannone was instructed to call 911 with any severe reactions post vaccine: Marland Kitchen Difficulty breathing  . Swelling of your face and throat  . A fast heartbeat  . A bad rash all over your body  . Dizziness and weakness    Immunizations Administered    Name Date Dose VIS Date Route   Pfizer COVID-19 Vaccine 10/04/2019 12:34 PM 0.3 mL 08/31/2019 Intramuscular   Manufacturer: [employer], [employer]   Lot: [id number]   NDC: [address]-4

## 2019-10-22 ENCOUNTER — Ambulatory Visit: Payer: Medicare Other

## 2019-10-26 ENCOUNTER — Ambulatory Visit: Payer: Medicare Other | Attending: Internal Medicine

## 2019-10-26 DIAGNOSIS — Z23 Encounter for immunization: Secondary | ICD-10-CM

## 2019-10-26 NOTE — Progress Notes (Signed)
   Covid-19 Vaccination Clinic  Name:  AMIRRAH QUIGLEY    MRN: [id number] DOB: 05/12/1951  10/26/2019  Ms. Hice was observed post Covid-19 immunization for 15 minutes without incidence. She was provided with Vaccine Information Sheet and instruction to access the V-Safe system.   Ms. Marshburn was instructed to call 911 with any severe reactions post vaccine: Marland Kitchen Difficulty breathing  . Swelling of your face and throat  . A fast heartbeat  . A bad rash all over your body  . Dizziness and weakness    Immunizations Administered    Name Date Dose VIS Date Route   Pfizer COVID-19 Vaccine 10/26/2019  1:53 PM 0.3 mL 08/31/2019 Intramuscular   Manufacturer: [employer], [employer]   Lot: [id number]   NDC: [address]-4

## 2020-12-05 ENCOUNTER — Telehealth: Payer: Self-pay | Admitting: Podiatry

## 2020-12-05 NOTE — Telephone Encounter (Signed)
Patient wanted to be worked in today for an urgent appointment, nothing was open. Set appointment for 3/25 for throbbing toe pain. Patient would like to know what she should do for the throbbing pain. Please advise.

## 2020-12-12 ENCOUNTER — Encounter: Payer: Self-pay | Admitting: Podiatry

## 2020-12-12 ENCOUNTER — Other Ambulatory Visit: Payer: Self-pay

## 2020-12-12 ENCOUNTER — Ambulatory Visit (INDEPENDENT_AMBULATORY_CARE_PROVIDER_SITE_OTHER): Payer: Medicare Other | Admitting: Podiatry

## 2020-12-12 DIAGNOSIS — L600 Ingrowing nail: Secondary | ICD-10-CM

## 2020-12-12 DIAGNOSIS — B351 Tinea unguium: Secondary | ICD-10-CM

## 2020-12-12 NOTE — Patient Instructions (Signed)

## 2020-12-14 NOTE — Progress Notes (Signed)
Subjective:   Patient ID: Linda Braun, female   DOB: 70 y.o.   MRN: [id number]   HPI Patient presents concerned about ingrown toenail deformity hallux bilateral medial border stating that it was very painful especially on the right throbbing now it seems a little better she wants to check for consideration of surgery at 1 point.  Patient does not smoke likes to be active   Review of Systems  All other systems reviewed and are negative.       Objective:  Physical Exam Vitals and nursing note reviewed.  Constitutional:      Appearance: She is well-developed.  Pulmonary:     Effort: Pulmonary effort is normal.  Musculoskeletal:        General: Normal range of motion.  Skin:    General: Skin is warm.  Neurological:     Mental Status: She is alert.     Neurovascular status found to be intact muscle strength was found to be adequate range of motion adequate with incurvation of the hallux nail medial border right and left no active drainage no redness noted currently.  Patient is found to have good digital perfusion well oriented x3     Assessment:  Ingrown toenail deformity hallux bilateral medial border with light redness but no pain of a significant nature     Plan:  H&P spent a great deal of time reviewing condition and treatment options and including nail surgery.  I did educate her on permanent procedure and at 1 point in future she will most likely get this done but at this point were just cannot hold off and she will use soaks and Band-Aids as needed

## 2023-02-24 ENCOUNTER — Encounter: Payer: Self-pay | Admitting: Infectious Diseases

## 2023-02-24 ENCOUNTER — Telehealth: Payer: Self-pay

## 2023-02-24 ENCOUNTER — Ambulatory Visit (INDEPENDENT_AMBULATORY_CARE_PROVIDER_SITE_OTHER): Payer: Medicare Other | Admitting: Infectious Diseases

## 2023-02-24 ENCOUNTER — Other Ambulatory Visit: Payer: Self-pay

## 2023-02-24 VITALS — BP 143/78 | HR 67 | Resp 16 | Ht 63.0 in | Wt 154.0 lb

## 2023-02-24 DIAGNOSIS — L97512 Non-pressure chronic ulcer of other part of right foot with fat layer exposed: Secondary | ICD-10-CM

## 2023-02-24 DIAGNOSIS — Z79899 Other long term (current) drug therapy: Secondary | ICD-10-CM

## 2023-02-24 DIAGNOSIS — M86171 Other acute osteomyelitis, right ankle and foot: Secondary | ICD-10-CM | POA: Diagnosis not present

## 2023-02-24 DIAGNOSIS — M869 Osteomyelitis, unspecified: Secondary | ICD-10-CM | POA: Insufficient documentation

## 2023-02-24 LAB — CBC
HCT: 42.3 % (ref 35.0–45.0)
MCH: 30.7 pg (ref 27.0–33.0)
MCHC: 33.6 g/dL (ref 32.0–36.0)
MCV: 91.4 fL (ref 80.0–100.0)
Platelets: 284 10*3/uL (ref 140–400)
RBC: 4.63 10*6/uL (ref 3.80–5.10)
RDW: 11.9 % (ref 11.0–15.0)
WBC: 5.4 10*3/uL (ref 3.8–10.8)

## 2023-02-24 LAB — SEDIMENTATION RATE: Sed Rate: 19 mm/h (ref 0–30)

## 2023-02-24 MED ORDER — LEVOFLOXACIN 750 MG PO TABS: 750.0000 mg | ORAL_TABLET | Freq: Every day | ORAL | 1 refills | Status: DC

## 2023-02-24 MED ORDER — FLUCONAZOLE 150 MG PO TABS
150.0000 mg | ORAL_TABLET | ORAL | 0 refills | Status: DC
Start: 2023-02-24 — End: 2023-03-21

## 2023-02-24 MED ORDER — LEVOFLOXACIN 750 MG PO TABS
750.0000 mg | ORAL_TABLET | Freq: Every day | ORAL | 1 refills | Status: DC
Start: 2023-02-24 — End: 2023-02-24

## 2023-02-24 MED ORDER — DOXYCYCLINE HYCLATE 100 MG PO TABS: 100.0000 mg | ORAL_TABLET | Freq: Two times a day (BID) | ORAL | 1 refills | Status: DC

## 2023-02-24 NOTE — Telephone Encounter (Signed)
Patient requesting labs to be sent to Dermatologist once resulted.

## 2023-02-24 NOTE — Telephone Encounter (Signed)
Sure, once resulted. We can have it faxed.

## 2023-02-24 NOTE — Patient Instructions (Signed)
Nice to meet you today! Double antibiotics, take as prescribed Return for care in 3-4 weeks, soon after you return from your [place] trip Call now to book a podiatry appointment to occur after your follow up with our infectious disease office Continue to bandage the wound as you have been Keep the area clean and dry Only need to wash with gentle soap and pat dry, no need to scrub hard  Reasons to seek care sooner: Fever and chills Severe swelling and pain Pain with walking that limits your daily activities Heat and discharge or puss coming from wound

## 2023-02-24 NOTE — Telephone Encounter (Signed)
Record from todays visit sent to [facility] : Dr Donette Larry.

## 2023-02-24 NOTE — Progress Notes (Signed)
Patient Active Problem List   Diagnosis Date Noted  . Acute medial meniscal tear 10/03/2013    Patient's Medications  New Prescriptions   No medications on file  Previous Medications   ACETAMINOPHEN (TYLENOL) 500 MG TABLET    Take 1,000 mg by mouth every 6 (six) hours as needed for mild pain or moderate pain.   CALCIUM CARBONATE-VITAMIN D (CALCIUM + D PO)    Take 1 tablet by mouth daily.   CLONAZEPAM (KLONOPIN) 0.25 MG DISINTEGRATING TABLET    Take 0.25 mg by mouth 2 (two) times daily as needed (for anxiety).    HYDROCHLOROTHIAZIDE (HYDRODIURIL) 25 MG TABLET    TAKE 1 TABLET BY MOUTH EVERY DAY IN THE MORNING FOR 90 DAYS   HYDROCODONE-ACETAMINOPHEN (NORCO) 5-325 MG PER TABLET    Take 1-2 tablets by mouth every 6 (six) hours as needed for moderate pain.   HYDROMORPHONE (DILAUDID) 2 MG TABLET    Take 1-2 tablets (2-4 mg total) by mouth every 4 (four) hours as needed for severe pain.   LOSARTAN (COZAAR) 100 MG TABLET    1 tablet   LOSARTAN-HYDROCHLOROTHIAZIDE (HYZAAR) 100-25 MG TABLET    losartan 100 mg-hydrochlorothiazide 25 mg tablet  TAKE 1 TABLET BY MOUTH EVERY DAY   METHOCARBAMOL (ROBAXIN) 500 MG TABLET    Take 1 tablet (500 mg total) by mouth 4 (four) times daily. As needed for muscle spasm   METHOCARBAMOL (ROBAXIN) 500 MG TABLET    Take 1 tablet (500 mg total) by mouth once.  Modified Medications   No medications on file  Discontinued Medications   No medications on file    Subjective: 72 year old female with prior history of hypertension was referred from dermatology for concerns of osteomyelitis of right fifth metatarsal seen in Xray rt foot. History taken from patient as well as outside records.   Patient reports she had cleaned glass pieces when a glass had fallen in her house and possibly thinks she might  have very minute piece of glass entered her foot which she did not notice at that time.  She noticed a speck of blood in that area couple of hours later where she put  Neosporin and a Band-Aid.  The following week she went to [place] for vacation where she slipped in the marble floor and injured her left forearm, she needed to have stitch placed in the left forearm which has resolved.  She was having some discomfort while walking at the same area in rt foot during early April and discussed with one of her physician friend who said that it does not look infected but to monitor her and see a doctor if looks worse.  She started seeing a dermatology April 19 who per her report scraped to see if any glass pieces inside. She had a skin biopsy of that area done in 5/20 with findings as below. She also had an xray later done on 5/28 to make sure there is no remaining foreign body which showed osteomyelitis.   She also tells me she has a trip planned to [place] next week which she cannot miss at any cost for her 51th anniversary. She has an upcoming graduation of her grand children which does not want to miss. Denies fevers, chills, night sweats. Denies nausea, vomiting, abdominal pain and diarrhea. Denies chest pain, cough and SOB. Denies GU symptoms, rashes, joint pain. Denies any drainage from the wound. She was started on doxycycline on 5/23 which she has been taking till  date. Discussed about seeing podiatry to get deep tissue/bone cx off abtx to which she says she cannot wait for that  Denies smoking, alcohol occasionally and denies IVDU.   Review of Systems: all systems reviewed with pertinent positives and negatives as listed above  Past Medical History:  Diagnosis Date  . Anxiety   . Meniscus tear    rt knee   Past Surgical History:  Procedure Laterality Date  . acustic neuroma's removed    . BONE FLAP RECONSTRUCTION SKULL     titanium plate  . BREAST REDUCTION SURGERY    . CESAREAN SECTION     x2  . KNEE ARTHROSCOPY Right 10/03/2013   Procedure: RIGHT ARTHROSCOPY KNEE WITH DEBRIDEMENT;  Surgeon: Loanne Drilling, MD;  Location: [facility];  Service: Orthopedics;   Laterality: Right;  . TONSILLECTOMY      Social History   Tobacco Use  . Smoking status: Former    Types: Cigarettes    Quit date: 09/28/1971    Years since quitting: 51.4  Substance Use Topics  . Alcohol use: No    Comment: occasional  . Drug use: No    No family history on file.  Allergies  Allergen Reactions  . Penicillins Anaphylaxis and Rash  . Betadine [Povidone Iodine] Itching and Rash  . Hydrocodone Rash  . Latex Rash  . Macrobid [Nitrofurantoin [employer] Macro] Hives    Health Maintenance  Topic Date Due  . Medicare Annual Wellness (AWV)  Never done  . Hepatitis C Screening  Never done  . DTaP/Tdap/Td (1 - Tdap) Never done  . Colonoscopy  Never done  . MAMMOGRAM  Never done  . Pneumonia Vaccine 7+ Years old (1 of 1 - PCV) Never done  . DEXA SCAN  Never done  . Zoster Vaccines- Shingrix (2 of 2) 10/02/2018  . COVID-19 Vaccine (3 - 2023-24 season) 05/21/2022  . INFLUENZA VACCINE  04/21/2023  . HPV VACCINES  Aged Out    Objective: BP (!) 143/78   Pulse 67   Resp 16   Ht 5\' 3"  (1.6 m)   Wt 154 lb (69.9 kg)   SpO2 98%   BMI 27.28 kg/m    Physical Exam Constitutional:      Appearance: Normal appearance.  HENT:     Head: Normocephalic and atraumatic.      Mouth: Mucous membranes are moist.  Eyes:    Conjunctiva/sclera: Conjunctivae normal.     Pupils: Pupils are equal, round, and reactive to light.   Cardiovascular:     Rate and Rhythm: Normal rate and regular rhythm.     Heart sounds: s1s2  Pulmonary:     Effort: Pulmonary effort is normal.     Breath sounds: Normal breath sounds.   Abdominal:     General: Non distended     Palpations: soft.   Musculoskeletal:        General: Normal range of motion.   Skin:    General: Skin is warm and dry.     Comments: Rt lateral foot ulcer with fat layer exposed, no muscle or bone visible. No active drainage or no surrounding cellulitis.    Neurological:     General: grossly non focal     Mental  Status: awake, alert and oriented to person, place, and time.   Psychiatric:        Mood and Affect: Mood normal.   Lab Results Lab Results  Component Value Date   WBC 6.5 09/27/2013   HGB  12.9 09/27/2013   HCT 37.1 09/27/2013   MCV 88.1 09/27/2013   PLT 296 09/27/2013    Lab Results  Component Value Date   CREATININE 0.50 04/29/2009   BUN 11 04/29/2009   NA 138 04/29/2009   K 4.1 04/29/2009   CL 106 04/29/2009   CO2 28 04/29/2009   No results found for: "ALT", "AST", "GGT", "ALKPHOS", "BILITOT"  No results found for: "CHOL", "HDL", "LDLCALC", "LDLDIRECT", "TRIG", "CHOLHDL" No results found for: "LABRPR", "RPRTITER" No results found for: "HIV1RNAQUANT", "HIV1RNAVL", "CD4TABS"       Assessment/Plan   I have personally spent at least 60 minutes involved in face-to-face and non-face-to-face activities for this patient on the day of the visit. Professional time spent includes the following activities: Preparing to see the patient (review of tests), Obtaining and/or reviewing separately obtained history (admission/discharge record), Performing a medically appropriate examination and/or evaluation , Ordering medications/tests/procedures, referring and communicating with other health care professionals, Documenting clinical information in the EMR, Independently interpreting results (not separately reported), Communicating results to the patient/family/caregiver, Counseling and educating the patient/family/caregiver and Care coordination (not separately reported).   Victoriano Lain, MD [facility] 02/24/2023, 7:20 AM

## 2023-02-25 ENCOUNTER — Ambulatory Visit: Payer: Medicare Other | Admitting: Infectious Diseases

## 2023-02-25 LAB — BASIC METABOLIC PANEL
BUN/Creatinine Ratio: 48 (calc) — ABNORMAL HIGH (ref 6–22)
BUN: 32 mg/dL — ABNORMAL HIGH (ref 7–25)
CO2: 26 mmol/L (ref 20–32)
Calcium: 10.3 mg/dL (ref 8.6–10.4)
Chloride: 103 mmol/L (ref 98–110)
Creat: 0.67 mg/dL (ref 0.60–1.00)
Glucose, Bld: 101 mg/dL — ABNORMAL HIGH (ref 65–99)
Potassium: 3.9 mmol/L (ref 3.5–5.3)
Sodium: 140 mmol/L (ref 135–146)

## 2023-02-25 LAB — C-REACTIVE PROTEIN: CRP: <3 mg/L (ref <8.0)

## 2023-02-25 LAB — CBC
Hemoglobin: 14.2 g/dL (ref 11.7–15.5)
MPV: 11.6 fL (ref 7.5–12.5)

## 2023-02-25 NOTE — Telephone Encounter (Signed)
Labs sent to [facility] [phone] Juanita Laster, RMA

## 2023-02-26 DIAGNOSIS — L97512 Non-pressure chronic ulcer of other part of right foot with fat layer exposed: Secondary | ICD-10-CM | POA: Insufficient documentation

## 2023-02-26 DIAGNOSIS — Z79899 Other long term (current) drug therapy: Secondary | ICD-10-CM | POA: Insufficient documentation

## 2023-03-03 ENCOUNTER — Ambulatory Visit (HOSPITAL_COMMUNITY)
Admission: RE | Admit: 2023-03-03 | Discharge: 2023-03-03 | Disposition: A | Discharge status: Home / Self Care | Payer: Medicare Other | Source: Ambulatory Visit | Attending: Infectious Diseases | Admitting: Infectious Diseases

## 2023-03-03 ENCOUNTER — Encounter: Payer: Self-pay | Admitting: Infectious Diseases

## 2023-03-03 DIAGNOSIS — M869 Osteomyelitis, unspecified: Secondary | ICD-10-CM | POA: Insufficient documentation

## 2023-03-03 MED ORDER — GADOBUTROL 1 MMOL/ML IV SOLN
7.0000 mL | Freq: Once | INTRAVENOUS | Status: AC | PRN
  Administered 2023-03-03: 7 mL via INTRAVENOUS

## 2023-03-03 NOTE — Telephone Encounter (Signed)
Per Dr.Mananadhar: No restrictions for analgesics from my end. Tylenol would be better than ibuprofen for safety of kidneys but she can discuss with PCP for pain management.    Patient aware.

## 2023-03-10 ENCOUNTER — Telehealth: Payer: Self-pay

## 2023-03-10 NOTE — Telephone Encounter (Signed)
Patient called to follow up on MRI done on 6/13. Informed her that we are still waiting for MRI to be read by radiology team. Would like call or mychart message when results are available to provider. Would like to inform MD that she is doing well overall. No issues with dressing change or antibiotics. Juanita Laster, RMA

## 2023-03-14 ENCOUNTER — Telehealth: Payer: Self-pay

## 2023-03-14 NOTE — Telephone Encounter (Signed)
Patient aware of results and to stop antibiotics.   Angee Gupton Lesli Albee, CMA

## 2023-03-14 NOTE — Telephone Encounter (Signed)
-----   Message from Odette Fraction, MD sent at 03/14/2023 10:59 AM EDT ----- Please let her know MRI Is negative for osteomyelitis. She has completed more than 2 weeks of abtx by now and can stop taking abtx.

## 2023-03-21 ENCOUNTER — Other Ambulatory Visit: Payer: Self-pay

## 2023-03-21 ENCOUNTER — Encounter: Payer: Self-pay | Admitting: Infectious Diseases

## 2023-03-21 ENCOUNTER — Ambulatory Visit (INDEPENDENT_AMBULATORY_CARE_PROVIDER_SITE_OTHER): Payer: Medicare Other | Admitting: Infectious Diseases

## 2023-03-21 VITALS — BP 113/78 | HR 77 | Resp 16 | Ht 63.0 in | Wt 154.0 lb

## 2023-03-21 DIAGNOSIS — L97512 Non-pressure chronic ulcer of other part of right foot with fat layer exposed: Secondary | ICD-10-CM | POA: Diagnosis present

## 2023-03-21 DIAGNOSIS — Z79899 Other long term (current) drug therapy: Secondary | ICD-10-CM | POA: Diagnosis not present

## 2023-03-21 NOTE — Progress Notes (Signed)
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     Subjective: 72 year old female with prior history of hypertension was referred from dermatology for concerns of osteomyelitis of right fifth metatarsal seen in Xray rt foot. History taken from patient as well as outside records.   Patient reports she had cleaned glass pieces when a glass had fallen in her house and possibly thinks she might  have very minute piece of glass entered her foot which she did not notice at that time.  She noticed a speck of blood in that area couple of hours later where she put Neosporin and a Band-Aid.  The following week she went to [place] for vacation where she slipped in the marble floor and injured her left forearm, she needed to have stitch placed in the left forearm which has resolved.  She was having some discomfort while walking at the same area in rt foot during early April and discussed with one of her physician friend who said that it does not look infected but to monitor her and see a doctor if looks worse.  She started seeing a dermatology April 19 who per her report scraped to see if any glass pieces inside. She had a skin biopsy of that area done in 5/20 with findings as below. She also had an xray later done on 5/28 to make sure there is no remaining foreign body which showed osteomyelitis.   She also tells me she has a trip planned to [place] next week which she cannot miss at any cost for her 51th anniversary. She has an  upcoming graduation of her grand children which does not want to miss. Denies fevers, chills, night sweats. Denies nausea, vomiting, abdominal pain and diarrhea. Denies chest pain, cough and SOB. Denies GU symptoms, rashes, joint pain. Denies any drainage from the wound. She was started on doxycycline on 5/23 which she has been taking till date. Discussed about seeing podiatry to get deep tissue/bone cx off abtx to which she says she cannot wait for that  Denies smoking, alcohol occasionally and denies IVDU.   03/21/23 Stopped taking PO abtx from 6/24 after 6/22 MRI negative for osteomyelitis. Had GI upset when on abtx which has resolved after stopping abtx. She had a nice trip to [place],  she thinks she might have put more pressure in the left leg due to rt foot wound and felt some pain in the left knee and calf which has improved today. Denies any left knee warmth, tenderness. Denies fevers, chills, nausea, vomiting. Denies any pain, swelling or drainage from rt foot wound. She has dermatology appt later today. Discussed about seeing wound care to which she agrees.   Review of Systems: all systems reviewed with pertinent positives and negatives as listed above  Past Medical History:  Diagnosis Date   Anxiety    Meniscus tear    rt knee   Past Surgical History:  Procedure Laterality Date   acustic neuroma's removed     BONE FLAP RECONSTRUCTION SKULL  titanium plate   BREAST REDUCTION SURGERY     CESAREAN SECTION     x2   KNEE ARTHROSCOPY Right 10/03/2013   Procedure: RIGHT ARTHROSCOPY KNEE WITH DEBRIDEMENT;  Surgeon: Loanne Drilling, MD;  Location: [facility];  Service: Orthopedics;  Laterality: Right;   TONSILLECTOMY      Social History   Tobacco Use   Smoking status: Former    Types: Cigarettes    Quit date: 09/28/1971    Years since quitting: 51.5  Substance Use Topics   Alcohol use: No    Comment: occasional   Drug use: No    No family history on file.  Allergies  Allergen  Reactions   Penicillins Anaphylaxis and Rash   Betadine [Povidone Iodine] Itching and Rash   Hydrocodone Rash   Latex Rash   Macrobid [Nitrofurantoin Monohyd Macro] Hives    Health Maintenance  Topic Date Due   Medicare Annual Wellness (AWV)  Never done   Hepatitis C Screening  Never done   DTaP/Tdap/Td (1 - Tdap) Never done   Colonoscopy  Never done   MAMMOGRAM  Never done   Pneumonia Vaccine 80+ Years old (1 of 1 - PCV) Never done   DEXA SCAN  Never done   Zoster Vaccines- Shingrix (2 of 2) 10/02/2018   COVID-19 Vaccine (3 - Pfizer risk series) 11/23/2019   INFLUENZA VACCINE  04/21/2023   HPV VACCINES  Aged Out    Objective: BP 113/78   Pulse 77   Resp 16   Ht 5\' 3"  (1.6 m)   Wt 154 lb (69.9 kg)   SpO2 98%   BMI 27.28 kg/m     Physical Exam Constitutional:      Appearance: Normal appearance.  HENT:     Head: Normocephalic and atraumatic.      Mouth: Mucous membranes are moist.  Eyes:    Conjunctiva/sclera: Conjunctivae normal.     Pupils: Pupils are equal, round, and reactive to light.   Cardiovascular:     Rate and Rhythm: Normal rate and regular rhythm.     Heart sounds: s1s2  Pulmonary:     Effort: Pulmonary effort is normal.     Breath sounds: Normal breath sounds.   Abdominal:     General: Non distended     Palpations: soft.   Musculoskeletal:        General: Normal range of motion.   Skin:    General: Skin is warm and dry.     Comments: Rt lateral foot ulcer with fat layer exposed, no muscle or bone visible. No active drainage or no surrounding cellulitis.    Neurological:     General: grossly non focal     Mental Status: awake, alert and oriented to person, place, and time.   Psychiatric:        Mood and Affect: Mood normal.   Lab Results Lab Results  Component Value Date   WBC 5.4 02/24/2023   HGB 14.2 02/24/2023   HCT 42.3 02/24/2023   MCV 91.4 02/24/2023   PLT 284 02/24/2023    Lab Results  Component Value Date    CREATININE 0.67 02/24/2023   BUN 32 (H) 02/24/2023   NA 140 02/24/2023   K 3.9 02/24/2023   CL 103 02/24/2023   CO2 26 02/24/2023   No results found for: "ALT", "AST", "GGT", "ALKPHOS", "BILITOT"  No results found for: "CHOL", "HDL", "LDLCALC", "LDLDIRECT", "TRIG", "CHOLHDL" No results found for: "LABRPR", "RPRTITER" No results found for: "HIV1RNAQUANT", "  HIV1RNAVL", "CD4TABS"   Imaging  MR FOOT RIGHT W WO CONTRAST  Result Date: 03/12/2023 CLINICAL DATA:  Soft tissue wound along the right lateral aspect of the foot. EXAM: MRI OF THE RIGHT FOREFOOT WITHOUT AND WITH CONTRAST TECHNIQUE: Multiplanar, multisequence MR imaging of the right forefoot was performed before and after the administration of intravenous contrast. CONTRAST:  7mL GADAVIST GADOBUTROL 1 MMOL/ML IV SOLN COMPARISON:  None Available. FINDINGS: Bones/Joint/Cartilage Soft tissue wound overlying the lateral base of the fifth metatarsal. No periosteal reaction or bone destruction. Normal alignment. No joint effusion. No acute fracture or dislocation. Mild osteoarthritis of the first MTP joint. Marrow edema in the medial hallux sesamoid as can be seen with sesamoiditis. Ligaments Collateral ligaments are intact.  Lisfranc ligament is intact. Muscles and Tendons Flexor, peroneal and extensor compartment tendons are intact. Muscles are normal. Soft tissue No fluid collection or hematoma. No soft tissue mass. Soft tissue edema around the soft tissue wound along the lateral aspect of the foot overlying the fifth metatarsal consistent with mild cellulitis. IMPRESSION: 1. Soft tissue wound along the lateral aspect of the foot overlying the fifth metatarsal consistent with mild cellulitis. No drainable fluid collection to suggest an abscess. No osteomyelitis of the fifth metatarsal. 2. Marrow edema in the medial hallux sesamoid as can be seen with sesamoiditis. Electronically Signed   By: Elige Ko M.D.   On: 03/12/2023 12:27      Assessment/Plan # Traumatic superficial ulcer in the rt lateral foot 2/2 glass # cellulitis  On doxycycline since 5/23 Added levofloxacin on 6/6 clinic visit as xray s/o osteomyelitis until MRI was back 6/24 doxycycline and levofloxacin stopped as MRI 6/22  was negative for osteomyelitis   Am referral to wound care placed  No indication for more abtx with no signs of infection and has completed adequate course for cellulitis  Fu as needed   I have personally spent 32 minutes involved in face-to-face and non-face-to-face activities for this patient on the day of the visit. Professional time spent includes the following activities: Preparing to see the patient (review of tests), Obtaining and/or reviewing separately obtained history (admission/discharge record), Performing a medically appropriate examination and/or evaluation , Ordering medications/tests/procedures, referring and communicating with other health care professionals, Documenting clinical information in the EMR, Independently interpreting results (not separately reported), Communicating results to the patient/family/caregiver, Counseling and educating the patient/family/caregiver and Care coordination (not separately reported).   Victoriano Lain, MD [facility] 03/21/2023, 9:25 AM

## 2023-03-23 ENCOUNTER — Ambulatory Visit: Payer: Medicare Other | Admitting: Podiatry

## 2023-03-23 ENCOUNTER — Encounter (HOSPITAL_BASED_OUTPATIENT_CLINIC_OR_DEPARTMENT_OTHER): Payer: Medicare Other | Attending: General Surgery | Admitting: Physician Assistant

## 2023-03-23 DIAGNOSIS — I10 Essential (primary) hypertension: Secondary | ICD-10-CM | POA: Diagnosis not present

## 2023-03-23 DIAGNOSIS — Z09 Encounter for follow-up examination after completed treatment for conditions other than malignant neoplasm: Secondary | ICD-10-CM | POA: Diagnosis not present

## 2023-03-23 DIAGNOSIS — L02611 Cutaneous abscess of right foot: Secondary | ICD-10-CM | POA: Diagnosis present

## 2023-03-23 DIAGNOSIS — L97512 Non-pressure chronic ulcer of other part of right foot with fat layer exposed: Secondary | ICD-10-CM | POA: Insufficient documentation

## 2023-03-23 DIAGNOSIS — Z87891 Personal history of nicotine dependence: Secondary | ICD-10-CM | POA: Diagnosis not present

## 2023-03-24 NOTE — Progress Notes (Signed)
GERTURDE, BLAKENSHIP ([id number]) [id number] Page 1 of 7 Visit Report for 03/23/2023 Allergy List Details Patient Name: Date of Service: Linda Braun 03/23/2023 8:00 A M Medical Record Number: [id number] Patient Account Number: [id number] Date of Birth/Sex: Treating RN: 02/26/1951 (72 y.o. Arta Silence Primary Care Malyk Girouard: Georgann Housekeeper Other Clinician: Referring Lashone Stauber: Treating Lindel Marcell/Extender: Verlon Setting, Karrar Weeks in Treatment: 0 Allergies Active Allergies Macrobid Reaction: hives Severity: Severe penicillin Severity: Severe Allergy Notes Electronic Signature(s) Signed: 03/23/2023 5:28:52 PM By: Karie Schwalbe RN Entered By: Karie Schwalbe on 03/23/2023 09:17:41 -------------------------------------------------------------------------------- Arrival Information Details Patient Name: Date of Service: Linda Braun, Linda N L. 03/23/2023 8:00 A M Medical Record Number: [id number] Patient Account Number: [id number] Date of Birth/Sex: Treating RN: 07-Feb-1951 (72 y.o. [name] Primary Care Hilery Wintle: Georgann Housekeeper Other Clinician: Referring Hisae Decoursey: Treating Evonte Prestage/Extender: Verlon Setting, Karrar Weeks in Treatment: 0 Visit Information Patient Arrived: Ambulatory Arrival Time: 08:07 Accompanied By: self Transfer Assistance: None Patient Identification Verified: Yes Electronic Signature(s) Signed: 03/23/2023 5:28:52 PM By: Karie Schwalbe RN Entered By: Karie Schwalbe on 03/23/2023 08:10:43 Clinic Level of Care Assessment Details -------------------------------------------------------------------------------- Linda Braun ([id number]) [id number] Page 2 of 7 Patient Name: Date of Service: Linda Braun 03/23/2023 8:00 A M Medical Record Number: [id number] Patient Account Number: [id number] Date of Birth/Sex: Treating RN: [birth year] (73 y.o. [name] Primary Care  Deronda Christian: Georgann Housekeeper Other Clinician: Referring Hurschel Paynter: Treating Jeimy Bickert/Extender: Verlon Setting, Karrar Weeks in Treatment: 0 Clinic Level of Care Assessment Items TOOL 1 Quantity Score X- 1 0 Use when EandM and Procedure is performed on INITIAL visit ASSESSMENTS - Nursing Assessment / Reassessment X- 1 20 General Physical Exam (combine w/ comprehensive assessment (listed just below) when performed on new pt. evals) X- 1 25 Comprehensive Assessment (HX, ROS, Risk Assessments, Wounds Hx, etc.) ASSESSMENTS - Wound and Skin Assessment / Reassessment X- 1 10 Dermatologic / Skin Assessment (not related to wound area) ASSESSMENTS - Ostomy and/or Continence Assessment and Care []  - 0 Incontinence Assessment and Management []  - 0 Ostomy Care Assessment and Management (repouching, etc.) PROCESS - Coordination of Care X - Simple Patient / Family Education for ongoing care 1 15 []  - 0 Complex (extensive) Patient / Family Education for ongoing care X- 1 10 Staff obtains Chiropractor, Records, T Results / Process Orders est X- 1 10 Staff telephones HHA, Nursing Homes / Clarify orders / etc []  - 0 Routine Transfer to another Facility (non-emergent condition) []  - 0 Routine Hospital Admission (non-emergent condition) X- 1 15 New Admissions / Manufacturing engineer / Ordering NPWT Apligraf, etc. , []  - 0 Emergency Hospital Admission (emergent condition) PROCESS - Special Needs []  - 0 Pediatric / Minor Patient Management []  - 0 Isolation Patient Management []  - 0 Hearing / Language / Visual special needs []  - 0 Assessment of Community assistance (transportation, D/C planning, etc.) []  - 0 Additional assistance / Altered mentation []  - 0 Support Surface(s) Assessment (bed, cushion, seat, etc.) INTERVENTIONS - Miscellaneous []  - 0 External ear exam []  - 0 Patient Transfer (multiple staff / Nurse, adult / Similar devices) []  - 0 Simple Staple / Suture removal (25  or less) []  - 0 Complex Staple / Suture removal (26 or more) []  - 0 Hypo/Hyperglycemic Management (do not check if billed separately) X- 1 15 Ankle / Brachial Index (ABI) - do not check if billed separately Has the patient been seen at the hospital within the last  three years: Yes Total Score: 120 Level Of Care: New/Established - Level 4 Electronic Signature(s) Signed: 03/23/2023 5:28:52 PM By: Karie Schwalbe RN Entered By: Karie Schwalbe on 03/23/2023 17:26:56 Linda Braun, Linda Braun ([id number]) [id number] Page 3 of 7 -------------------------------------------------------------------------------- Encounter Discharge Information Details Patient Name: Date of Service: Linda Braun 03/23/2023 8:00 A M Medical Record Number: [id number] Patient Account Number: [id number] Date of Birth/Sex: Treating RN: 03/03/1951 (72 y.o. [name] Primary Care Chioma Mukherjee: Georgann Housekeeper Other Clinician: Referring Rusti Arizmendi: Treating Dwon Sky/Extender: Verlon Setting, Karrar Weeks in Treatment: 0 Encounter Discharge Information Items Post Procedure Vitals Discharge Condition: Stable Temperature (F): 98.8 Ambulatory Status: Ambulatory Pulse (bpm): 64 Discharge Destination: Home Respiratory Rate (breaths/min): 18 Transportation: Private Auto Blood Pressure (mmHg): 115/73 Accompanied By: self Schedule Follow-up Appointment: Yes Clinical Summary of Care: Patient Declined Electronic Signature(s) Signed: 03/23/2023 5:28:52 PM By: Karie Schwalbe RN Entered By: Karie Schwalbe on 03/23/2023 17:27:57 -------------------------------------------------------------------------------- Lower Extremity Assessment Details Patient Name: Date of Service: Linda Braun, Linda N L. 03/23/2023 8:00 A M Medical Record Number: [id number] Patient Account Number: [id number] Date of Birth/Sex: Treating RN: May 15, 1951 (72 y.o. [name] Primary Care Rosalynd Mcwright: Georgann Housekeeper Other  Clinician: Referring Sami Froh: Treating Ramsey Midgett/Extender: Verlon Setting, Karrar Weeks in Treatment: 0 Edema Assessment Assessed: [Left: No] [Right: No] [Left: Edema] [Right: :] Calf Left: Right: Point of Measurement: From Medial Instep 34.5 cm Ankle Left: Right: Point of Measurement: From Medial Instep 20 cm Vascular Assessment Pulses: Dorsalis Pedis Palpable: [Right:Yes] Blood Pressure: Brachial: [Right:115] Ankle: [Right:Dorsalis Pedis: 122 1.06] Electronic Signature(s) Signed: 03/23/2023 5:28:52 PM By: Karie Schwalbe RN Entered By: Karie Schwalbe on 03/23/2023 09:00:30 Linda Braun ([id number]) [id number] Page 4 of 7 -------------------------------------------------------------------------------- Multi-Disciplinary Care Plan Details Patient Name: Date of Service: Linda Braun 03/23/2023 8:00 A M Medical Record Number: [id number] Patient Account Number: [id number] Date of Birth/Sex: Treating RN: 23-Apr-1951 (72 y.o. [name] Primary Care Marcellius Montagna: Georgann Housekeeper Other Clinician: Referring Adriana Lina: Treating Idell Hissong/Extender: Verlon Setting, Karrar Weeks in Treatment: 0 Active Inactive Wound/Skin Impairment Nursing Diagnoses: Impaired tissue integrity Goals: Patient/caregiver will verbalize understanding of skin care regimen Date Initiated: 03/23/2023 Target Resolution Date: 05/21/2023 Goal Status: Active Interventions: Assess ulceration(s) every visit Treatment Activities: Skin care regimen initiated : 03/23/2023 Notes: Electronic Signature(s) Signed: 03/23/2023 5:28:52 PM By: Karie Schwalbe RN Entered By: Karie Schwalbe on 03/23/2023 09:09:21 -------------------------------------------------------------------------------- Pain Assessment Details Patient Name: Date of Service: Linda Braun, Linda N L. 03/23/2023 8:00 A M Medical Record Number: [id number] Patient Account Number: [id number] Date of Birth/Sex:  Treating RN: 1951/09/08 (72 y.o. [name] Primary Care Ellison Rieth: Georgann Housekeeper Other Clinician: Referring Gilliam Hawkes: Treating Shakiera Edelson/Extender: Verlon Setting, Karrar Weeks in Treatment: 0 Active Problems Location of Pain Severity and Description of Pain Patient Has Paino No Site Locations Linda Braun, Linda Braun ([id number]) [phone].pdf Page 5 of 7 Pain Management and Medication Current Pain Management: Electronic Signature(s) Signed: 03/23/2023 5:28:52 PM By: Karie Schwalbe RN Entered By: Karie Schwalbe on 03/23/2023 09:00:46 -------------------------------------------------------------------------------- Patient/Caregiver Education Details Patient Name: Date of Service: Linda Braun 7/3/2024andnbsp8:00 A M Medical Record Number: [id number] Patient Account Number: [id number] Date of Birth/Gender: Treating RN: Mar 19, 1951 (72 y.o. [name] Primary Care Physician: Georgann Housekeeper Other Clinician: Referring Physician: Treating Physician/Extender: Verlon Setting, Derald Macleod in Treatment: 0 Education Assessment Education Provided To: Patient Education Topics Provided Wound/Skin Impairment: Methods: Explain/Verbal Responses: Return demonstration correctly Electronic Signature(s) Signed: 03/23/2023 5:28:52 PM By: Karie Schwalbe  RN Entered By: Karie Schwalbe on 03/23/2023 17:26:14 -------------------------------------------------------------------------------- Wound Assessment Details Patient Name: Date of Service: Linda Braun 03/23/2023 8:00 A M Medical Record Number: [id number] Patient Account Number: [id number] Date of Birth/Sex: Treating RN: Aug 31, 1951 (72 y.o. [name] Primary Care Maryln Eastham: Georgann Housekeeper Other Clinician: Referring Rashaud Ybarbo: Treating Hogan Hoobler/Extender: Evalee Mutton ([id number]) [phone].pdf Page 6 of 7 Weeks in  Treatment: 0 Wound Status Wound Number: 1 Primary Etiology: Abscess Wound Location: Right, Lateral Foot Wound Status: Open Wounding Event: Trauma Comorbid History: Hypertension Date Acquired: 12/13/2022 Weeks Of Treatment: 0 Clustered Wound: No Photos Wound Measurements Length: (cm) 1 Width: (cm) 1.2 Depth: (cm) 0.3 Area: (cm) 0.942 Volume: (cm) 0.283 % Reduction in Area: % Reduction in Volume: Epithelialization: Small (1-33%) Tunneling: No Undermining: No Wound Description Classification: Full Thickness Without Exposed Support Structures Exudate Amount: Medium Exudate Type: Serosanguineous Exudate Color: red, brown Foul Odor After Cleansing: No Slough/Fibrino Yes Wound Bed Granulation Amount: Small (1-33%) Exposed Structure Granulation Quality: Pink Fascia Exposed: No Necrotic Amount: Large (67-100%) Fat Layer (Subcutaneous Tissue) Exposed: Yes Necrotic Quality: Eschar, Adherent Slough Tendon Exposed: No Muscle Exposed: No Joint Exposed: No Bone Exposed: No Periwound Skin Texture Texture Color No Abnormalities Noted: Yes No Abnormalities Noted: Yes Moisture Temperature / Pain No Abnormalities Noted: Yes Temperature: No Abnormality Treatment Notes Wound #1 (Foot) Wound Laterality: Right, Lateral Cleanser Soap and Water Discharge Instruction: May shower and wash wound with dial antibacterial soap and water prior to dressing change. Vashe 5.8 (oz) Discharge Instruction: Cleanse the wound with Vashe prior to applying a clean dressing using gauze sponges, not tissue or cotton balls. Wound Cleanser Discharge Instruction: Cleanse the wound with wound cleanser prior to applying a clean dressing using gauze sponges, not tissue or cotton balls. Byram Ancillary Kit - 15 Day Supply Discharge Instruction: Use supplies as instructed; Kit contains: (15) Saline Bullets; (15) 3x3 Gauze; 15 pr Gloves Peri-Wound Care Skin Prep Discharge Instruction: Use skin prep as  directed Linda Braun, Linda Braun ([id number]) [id number] Page 7 of 7 Topical Primary Dressing Promogran Prisma Matrix, 4.34 (sq in) (silver collagen) Discharge Instruction: Moisten collagen with saline or hydrogel Secondary Dressing Bordered Gauze, 2x3.75 in Discharge Instruction: Apply over primary dressing as directed. Secured With Compression Wrap Compression Stockings Facilities manager) Signed: 03/23/2023 4:48:32 PM By: Shawn Stall RN, BSN Signed: 03/23/2023 5:28:52 PM By: Karie Schwalbe RN Entered By: Shawn Stall on 03/23/2023 08:34:08 -------------------------------------------------------------------------------- Vitals Details Patient Name: Date of Service: Linda Braun, Linda N L. 03/23/2023 8:00 A M Medical Record Number: [id number] Patient Account Number: [id number] Date of Birth/Sex: Treating RN: 12-03-1950 (72 y.o. [name] Primary Care Pookela Sellin: Georgann Housekeeper Other Clinician: Referring Heath Badon: Treating Kaileena Obi/Extender: Verlon Setting, Karrar Weeks in Treatment: 0 Vital Signs Time Taken: 08:10 Temperature (F): 98.8 Height (in): 63 Pulse (bpm): 64 Weight (lbs): 154 Respiratory Rate (breaths/min): 18 Body Mass Index (BMI): 27.3 Blood Pressure (mmHg): 115/73 Reference Range: 80 - 120 mg / dl Electronic Signature(s) Signed: 03/23/2023 5:28:52 PM By: Karie Schwalbe RN Entered By: Karie Schwalbe on 03/23/2023 09:17:20

## 2023-03-24 NOTE — Progress Notes (Signed)
ZOII, KALIN ([id number]) [id number] Page 1 of 4 Visit Report for 03/23/2023 Abuse Risk Screen Details Patient Name: Date of Service: Linda Braun 03/23/2023 8:00 A M Medical Record Number: [id number] Patient Account Number: [id number] Date of Birth/Sex: Treating RN: 1951-07-26 (72 y.o. Linda Braun Primary Care Linda Braun: Linda Braun Other Clinician: Referring Linda Braun: Treating Linda Braun/Extender: Linda Braun, Linda Braun in Treatment: 0 Abuse Risk Screen Items Answer ABUSE RISK SCREEN: Has anyone close to you tried to hurt or harm you recentlyo No Do you feel uncomfortable with anyone in your familyo No Has anyone forced you do things that you didnt want to doo No Electronic Signature(s) Signed: 03/23/2023 5:28:52 PM By: Karie Schwalbe RN Entered By: Karie Schwalbe on 03/23/2023 08:58:53 -------------------------------------------------------------------------------- Activities of Daily Living Details Patient Name: Date of Service: Linda Braun 03/23/2023 8:00 A M Medical Record Number: [id number] Patient Account Number: [id number] Date of Birth/Sex: Treating RN: 1951/08/20 (72 y.o. Linda Braun Primary Care Linda Braun: Linda Braun Other Clinician: Referring Linda Braun: Treating Linda Braun/Extender: Linda Braun, Linda Braun in Treatment: 0 Activities of Daily Living Items Answer Activities of Daily Living (Please select one for each item) Drive Automobile Completely Able T Medications ake Completely Able Use T elephone Completely Able Care for Appearance Completely Able Use T oilet Completely Able Bath / Shower Completely Able Dress Self Completely Able Feed Self Completely Able Walk Completely Able Get In / Out Bed Completely Able Housework Completely Able Prepare Meals Completely Able Handle Money Completely Able Shop for Self Completely Able Electronic Signature(s) Signed: 03/23/2023  5:28:52 PM By: Karie Schwalbe RN Entered By: Karie Schwalbe on 03/23/2023 08:59:28 Linn, Linda Braun ([id number]) [id number] Page 2 of 4 -------------------------------------------------------------------------------- Education Screening Details Patient Name: Date of Service: Linda Braun 03/23/2023 8:00 A M Medical Record Number: [id number] Patient Account Number: [id number] Date of Birth/Sex: Treating RN: [birth year] (72 y.o. Linda Braun Primary Care Linda Braun: Linda Braun Other Clinician: Referring Linda Braun: Treating Linda Braun/Extender: Linda Braun, Linda Braun in Treatment: 0 Primary Learner Assessed: Patient Learning Preferences/Education Level/Primary Language Learning Preference: Explanation, Demonstration, Printed Material Highest Education Level: College or Above Preferred Language: English Cognitive Barrier Language Barrier: No Translator Needed: No Memory Deficit: No Emotional Barrier: No Cultural/Religious Beliefs Affecting Medical Care: No Physical Barrier Impaired Vision: No Impaired Hearing: No Decreased Hand dexterity: No Knowledge/Comprehension Knowledge Level: High Comprehension Level: High Ability to understand written instructions: High Ability to understand verbal instructions: High Motivation Anxiety Level: Calm Cooperation: Cooperative Education Importance: Acknowledges Need Interest in Health Problems: Asks Questions Perception: Coherent Willingness to Engage in Self-Management High Activities: Readiness to Engage in Self-Management High Activities: Electronic Signature(s) Signed: 03/23/2023 5:28:52 PM By: Karie Schwalbe RN Entered By: Karie Schwalbe on 03/23/2023 09:00:01 -------------------------------------------------------------------------------- Fall Risk Assessment Details Patient Name: Date of Service: Linda Braun, Linda Braun. 03/23/2023 8:00 A M Medical Record Number:  [id number] Patient Account Number: [id number] Date of Birth/Sex: Treating RN: 1950-09-25 (72 y.o. Linda Braun Primary Care Linda Braun: Linda Braun Other Clinician: Referring Linda Braun: Treating Linda Braun/Extender: Linda Braun, Linda Braun in Treatment: 0 Fall Risk Assessment Items Have you had 2 or more falls in the last 12 monthso 0 No Linda Braun, Linda Braun ([id number]) [id number] Page 3 of 4 Have you had any fall that resulted in injury in the last 12 monthso 0 No FALLS RISK SCREEN History of falling - immediate or within 3 months 0 No Secondary diagnosis (Do you have  2 or more medical diagnoseso) 0 No Ambulatory aid None/bed rest/wheelchair/nurse 0 No Crutches/cane/walker 0 No Furniture 0 No Intravenous therapy Access/Saline/Heparin Lock 0 No Gait/Transferring Normal/ bed rest/ wheelchair 0 No Weak (short steps with or without shuffle, stooped but able to lift head while walking, may seek 0 No support from furniture) Impaired (short steps with shuffle, may have difficulty arising from chair, head down, impaired 0 No balance) Mental Status Oriented to own ability 0 No Electronic Signature(s) Signed: 03/23/2023 5:28:52 PM By: Karie Schwalbe RN Entered By: Karie Schwalbe on 03/23/2023 09:00:10 -------------------------------------------------------------------------------- Foot Assessment Details Patient Name: Date of Service: Linda Braun, Linda Braun. 03/23/2023 8:00 A M Medical Record Number: [id number] Patient Account Number: [id number] Date of Birth/Sex: Treating RN: [birth year] (72 y.o. Linda Braun Primary Care Linda Braun: Linda Braun Other Clinician: Referring Linda Braun: Treating Linda Braun/Extender: Linda Braun, Linda Braun in Treatment: 0 Foot Assessment Items Site Locations + = Sensation present, - = Sensation absent, C = Callus, U = Ulcer R = Redness, W = Warmth, M = Maceration, PU = Pre-ulcerative lesion F =  Fissure, S = Swelling, D = Dryness Assessment Right: Left: Other Deformity: No No Prior Foot Ulcer: No No Prior Amputation: No No Charcot Joint: No No Ambulatory Status: Ambulatory Without Help Gait: Steady Linda Braun, Linda Braun ([id number]) [id number] Page 4 of 4 Electronic Signature(s) Signed: 03/23/2023 5:28:52 PM By: Karie Schwalbe RN Entered By: Karie Schwalbe on 03/23/2023 09:00:23 -------------------------------------------------------------------------------- Nutrition Risk Screening Details Patient Name: Date of Service: Linda Braun 03/23/2023 8:00 A M Medical Record Number: [id number] Patient Account Number: [id number] Date of Birth/Sex: Treating RN: 11-29-1950 (72 y.o. Linda Braun Primary Care Zerrick Hanssen: Linda Braun Other Clinician: Referring Marijean Montanye: Treating Jarry Manon/Extender: Linda Braun, Linda Braun in Treatment: 0 Height (in): 63 Weight (lbs): Body Mass Index (BMI): Nutrition Risk Screening Items Score Screening NUTRITION RISK SCREEN: I have an illness or condition that made me change the kind and/or amount of food I eat 0 No I eat fewer than two meals per day 0 No I eat few fruits and vegetables, or milk products 0 No I have three or more drinks of beer, liquor or wine almost every day 0 No I have tooth or mouth problems that make it hard for me to eat 0 No I don't always have enough money to buy the food I need 0 No I eat alone most of the time 0 No I take three or more different prescribed or over-the-counter drugs a day 0 No Without wanting to, I have lost or gained 10 pounds in the last six months 0 No I am not always physically able to shop, cook and/or feed myself 0 No Nutrition Protocols Good Risk Protocol 0 No interventions needed Moderate Risk Protocol High Risk Proctocol Risk Level: Good Risk Score: 0 Electronic Signature(s) Signed: 03/23/2023 5:28:52 PM By: Karie Schwalbe RN Entered By:  Karie Schwalbe on 03/23/2023 09:00:18

## 2023-03-25 NOTE — Progress Notes (Signed)
Braun, Linda ([id number]) [id number] Page 1 of 8 Visit Report for 03/23/2023 Chief Complaint Document Details Patient Name: Date of Service: Linda Braun 03/23/2023 8:00 A M Medical Record Number: [id number] Patient Account Number: [id number] Date of Birth/Sex: Treating RN: 07/09/1951 (72 y.o. F) Primary Care Provider: Georgann Housekeeper Other Clinician: Referring Provider: Treating Provider/Extender: Verlon Setting, Karrar Weeks in Treatment: 0 Information Obtained from: Patient Chief Complaint Right lateral foot ulcer/abscess Electronic Signature(s) Signed: 03/23/2023 8:52:57 AM By: Allen Derry PA-C Entered By: Allen Derry on 03/23/2023 08:52:57 -------------------------------------------------------------------------------- Debridement Details Patient Name: Date of Service: Linda Braun, Linda N L. 03/23/2023 8:00 A M Medical Record Number: [id number] Patient Account Number: [id number] Date of Birth/Sex: Treating RN: 07-Feb-1951 (72 y.o. Linda Braun Primary Care Provider: Georgann Housekeeper Other Clinician: Referring Provider: Treating Provider/Extender: Verlon Setting, Karrar Weeks in Treatment: 0 Debridement Performed for Assessment: Wound #1 Right,Lateral Foot Performed By: Physician Lenda Kelp, PA Debridement Type: Debridement Level of Consciousness (Pre-procedure): Awake and Alert Pre-procedure Verification/Time Out Yes - 09:02 Taken: Start Time: 09:02 Pain Control: Lidocaine 5% topical ointment Percent of Wound Bed Debrided: 100% T Area Debrided (cm): otal 0.94 Tissue and other material debrided: Viable, Non-Viable, Callus, Slough, Subcutaneous, Biofilm, Slough Level: Skin/Subcutaneous Tissue Debridement Description: Excisional Instrument: Curette Bleeding: Minimum Hemostasis Achieved: Pressure End Time: 09:04 Procedural Pain: 0 Post Procedural Pain: 0 Response to Treatment: Procedure was tolerated well Level of  Consciousness (Post- Awake and Alert procedure): Post Debridement Measurements of Total Wound Length: (cm) 1 Width: (cm) 1.2 Depth: (cm) 0.3 Volume: (cm) 0.283 Character of Wound/Ulcer Post Debridement: Improved Linda Braun, Linda Braun ([id number]) [id number] Page 2 of 8 Post Procedure Diagnosis Same as Pre-procedure Notes Scribed for Allen Derry PA, by [employer]) Signed: 03/23/2023 3:56:42 PM By: Allen Derry PA-C Signed: 03/23/2023 5:28:52 PM By: Karie Schwalbe RN Entered By: Karie Schwalbe on 03/23/2023 09:28:01 -------------------------------------------------------------------------------- HPI Details Patient Name: Date of Service: Linda Braun, Linda N L. 03/23/2023 8:00 A M Medical Record Number: [id number] Patient Account Number: [id number] Date of Birth/Sex: Treating RN: 1951/07/10 (72 y.o. F) Primary Care Provider: Georgann Housekeeper Other Clinician: Referring Provider: Treating Provider/Extender: Verlon Setting, Karrar Weeks in Treatment: 0 History of Present Illness HPI Description: 03-23-2023 upon evaluation today patient appears to be doing somewhat poorly in regard to her wound on the lateral aspect of her foot. She tells me that somewhere around March around the middle she had something that shattered in her house this was glass and she went into cleaning up she was barefoot. Subsequently she ended up getting a shard of glass in her right lateral foot at first she did not notice this and then has things went over the next week or so she began to have an issue with this region. She originally somewhere around the middle of April went to dermatology where they attempted to debride and remove anything that was fairly good x-ray that showed nothing that was remaining in the foot they felt they gotten all. Subsequently she ended up having this worsen yet again and it was around this time that eventually they ordered an MRI as well as make  an ID referral due to concerns about the x- ray showing osteomyelitis or at least a possibility of this. It ended up that the MRI showed no evidence of osteomyelitis which is great news. Nonetheless at this point the Levaquin as well as the doxycycline was stopped she had been on this for quite a  while. It was also at this point that she was referred to me for further evaluation and treatment due to this wound. Patient does have a history of hypertension but really no other major medical problems she typically is not an individual to take a lot of medications and she appears to be in very good health. Electronic Signature(s) Signed: 03/23/2023 3:58:22 PM By: Allen Derry PA-C Entered By: Allen Derry on 03/23/2023 15:58:21 -------------------------------------------------------------------------------- Physical Exam Details Patient Name: Date of Service: Linda Braun, Linda N L. 03/23/2023 8:00 A M Medical Record Number: [id number] Patient Account Number: [id number] Date of Birth/Sex: Treating RN: Nov 06, 1950 (72 y.o. F) Primary Care Provider: Georgann Housekeeper Other Clinician: Referring Provider: Treating Provider/Extender: Verlon Setting, Karrar Weeks in Treatment: 0 Constitutional sitting or standing blood pressure is within target range for patient.. pulse regular and within target range for patient.Marland Kitchen respirations regular, non-labored and within target range for patient.Marland Kitchen temperature within target range for patient.. Well-nourished and well-hydrated in no acute distress. Eyes conjunctiva clear no eyelid edema noted. pupils equal round and reactive to light and accommodation. Ears, Nose, Mouth, and Throat no gross abnormality of ear auricles or external auditory canals. normal hearing noted during conversation. mucus membranes moist. Linda, Braun ([id number]) [id number] Page 3 of 8 Respiratory normal breathing without difficulty. Cardiovascular 2+ dorsalis  pedis/posterior tibialis pulses. no clubbing, cyanosis, significant edema, <3 sec cap refill. Musculoskeletal normal gait and posture. no significant deformity or arthritic changes, no loss or range of motion, no clubbing. Psychiatric this patient is able to make decisions and demonstrates good insight into disease process. Alert and Oriented x 3. pleasant and cooperative. Notes Upon inspection patient's wound bed actually showed signs of good granulation and epithelization at this point. Fortunately I do not see any signs of active infection locally nor systemically which is great news and in general I do believe that we are moving in the right direction here. No fevers, chills, nausea, vomiting, or diarrhea. I did have to perform some sharp debridement due to some slough and biofilm buildup on the surface of the wound she tolerated this debridement today without complication and postdebridement the wound bed appears to be doing much better which is great news. Overall I am extremely pleased with what we are seeing I think she is actually pretty close to complete resolution. Electronic Signature(s) Signed: 03/23/2023 3:59:25 PM By: Allen Derry PA-C Previous Signature: 03/23/2023 3:58:50 PM Version By: Allen Derry PA-C Entered By: Allen Derry on 03/23/2023 15:59:25 -------------------------------------------------------------------------------- Physician Orders Details Patient Name: Date of Service: Linda Braun, Linda N L. 03/23/2023 8:00 A M Medical Record Number: [id number] Patient Account Number: [id number] Date of Birth/Sex: Treating RN: 02-17-1951 (72 y.o. Linda Braun Primary Care Provider: Georgann Housekeeper Other Clinician: Referring Provider: Treating Provider/Extender: Verlon Setting, Karrar Weeks in Treatment: 0 Verbal / Phone Orders: No Diagnosis Coding ICD-10 Coding Code Description L02.611 Cutaneous abscess of right foot L97.512 Non-pressure chronic ulcer of other part of  right foot with fat layer exposed I10 Essential (primary) hypertension Follow-up Appointments ppointment in 1 week. Allen Derry PA-C Room 7 or 9 Wednesday July 10th at 10:30am Return A Other: - Keep taking the Probiotic for a couple of weeks, as so desired. Anesthetic (In clinic) Topical Lidocaine 5% applied to wound bed Bathing/ Shower/ Hygiene May shower and wash wound with soap and water. - Wash/shower and at the end of bathing, wash foot with Dial soap (Gold). On days when not changing wound dressing, during  the shower, put a plastic bag on foot to keep foot dry. Off-Loading Other: - Elevate feet when sitting (at heart level or above, as tolerated) Wound Treatment Wound #1 - Foot Wound Laterality: Right, Lateral Cleanser: Soap and Water Every Other Day/30 Days Discharge Instructions: May shower and wash wound with dial antibacterial soap and water prior to dressing change. Cleanser: Vashe 5.8 (oz) Every Other Day/30 Days Discharge Instructions: Cleanse the wound with Vashe prior to applying a clean dressing using gauze sponges, not tissue or cotton balls. Cleanser: Wound Cleanser (DME) (Generic) Every Other Day/30 Days Discharge Instructions: Cleanse the wound with wound cleanser prior to applying a clean dressing using gauze sponges, not tissue or cotton balls. Cleanser: Byram Ancillary Kit - 15 Day Supply Every Other Day/30 Days CHELESE, LANDERS ([id number]) [id number] Page 4 of 8 Discharge Instructions: Use supplies as instructed; Kit contains: (15) Saline Bullets; (15) 3x3 Gauze; 15 pr Gloves Peri-Wound Care: Skin Prep (DME) (Generic) Every Other Day/30 Days Discharge Instructions: Use skin prep as directed Prim Dressing: Promogran Prisma Matrix, 4.34 (sq in) (silver collagen) (DME) (Generic) Every Other Day/30 Days ary Discharge Instructions: Moisten collagen with saline or hydrogel Secondary Dressing: Bordered Gauze, 2x3.75 in (DME) (Generic) Every  Other Day/30 Days Discharge Instructions: Apply over primary dressing as directed. Electronic Signature(s) Signed: 03/23/2023 3:56:42 PM By: Allen Derry PA-C Signed: 03/23/2023 5:28:52 PM By: Karie Schwalbe RN Entered By: Karie Schwalbe on 03/23/2023 09:35:25 -------------------------------------------------------------------------------- Problem List Details Patient Name: Date of Service: Linda Braun, Linda N L. 03/23/2023 8:00 A M Medical Record Number: [id number] Patient Account Number: [id number] Date of Birth/Sex: Treating RN: Nov 09, 1950 (72 y.o. F) Primary Care Provider: Georgann Housekeeper Other Clinician: Referring Provider: Treating Provider/Extender: Verlon Setting, Karrar Weeks in Treatment: 0 Active Problems ICD-10 Encounter Code Description Active Date MDM Diagnosis L02.611 Cutaneous abscess of right foot 03/23/2023 No Yes L97.512 Non-pressure chronic ulcer of other part of right foot with fat layer exposed 03/23/2023 No Yes I10 Essential (primary) hypertension 03/23/2023 No Yes Inactive Problems Resolved Problems Electronic Signature(s) Signed: 03/23/2023 8:52:08 AM By: Allen Derry PA-C Entered By: Allen Derry on 03/23/2023 08:52:07 -------------------------------------------------------------------------------- Progress Note Details Patient Name: Date of Service: Linda Braun, Linda N L. 03/23/2023 8:00 A M Medical Record Number: [id number] Patient Account Number: [id number] Date of Birth/Sex: Treating RN: Jun 06, 1951 (72 y.o. ASHTYNN, SCHNELL ([id number]) [id number] Page 5 of 8 Primary Care Provider: Georgann Housekeeper Other Clinician: Referring Provider: Treating Provider/Extender: Verlon Setting, Karrar Weeks in Treatment: 0 Subjective Chief Complaint Information obtained from Patient Right lateral foot ulcer/abscess History of Present Illness (HPI) 03-23-2023 upon evaluation today patient appears to be doing somewhat poorly in regard to her  wound on the lateral aspect of her foot. She tells me that somewhere around March around the middle she had something that shattered in her house this was glass and she went into cleaning up she was barefoot. Subsequently she ended up getting a shard of glass in her right lateral foot at first she did not notice this and then has things went over the next week or so she began to have an issue with this region. She originally somewhere around the middle of April went to dermatology where they attempted to debride and remove anything that was fairly good x-ray that showed nothing that was remaining in the foot they felt they gotten all. Subsequently she ended up having this worsen yet again and it was around this time that eventually they ordered an MRI  as well as make an ID referral due to concerns about the x-ray showing osteomyelitis or at least a possibility of this. It ended up that the MRI showed no evidence of osteomyelitis which is great news. Nonetheless at this point the Levaquin as well as the doxycycline was stopped she had been on this for quite a while. It was also at this point that she was referred to me for further evaluation and treatment due to this wound. Patient does have a history of hypertension but really no other major medical problems she typically is not an individual to take a lot of medications and she appears to be in very good health. Patient History Information obtained from Patient. Allergies Macrobid (Severity: Severe, Reaction: hives), penicillin (Severity: Severe) Family History Unknown History. Social History Former smoker - quit 1973, Alcohol Use - Never, Drug Use - No History, Caffeine Use - Never. Medical History Cardiovascular Patient has history of Hypertension Hospitalization/Surgery History - 2015 right knee arthroscopy. - Left acoustic neuroma excision. - bone flap reconstructed skull (titanium plate). - breast reduction surgery. - c-section. -  tonsillectomy. Medical A Surgical History Notes nd Ear/Nose/Mouth/Throat Left ear neuroma Musculoskeletal meniscus tear Psychiatric anxiety Review of Systems (ROS) Integumentary (Skin) Complains or has symptoms of Wounds - Right Lateral foot-abscess. Objective Constitutional sitting or standing blood pressure is within target range for patient.. pulse regular and within target range for patient.Marland Kitchen respirations regular, non-labored and within target range for patient.Marland Kitchen temperature within target range for patient.. Well-nourished and well-hydrated in no acute distress. Vitals Time Taken: 8:10 AM, Height: 63 in, Weight: 154 lbs, BMI: 27.3, Temperature: 98.8 F, Pulse: 64 bpm, Respiratory Rate: 18 breaths/min, Blood Pressure: 115/73 mmHg. Eyes conjunctiva clear no eyelid edema noted. pupils equal round and reactive to light and accommodation. Ears, Nose, Mouth, and Throat no gross abnormality of ear auricles or external auditory canals. normal hearing noted during conversation. mucus membranes moist. Respiratory normal breathing without difficulty. Cardiovascular 2+ dorsalis pedis/posterior tibialis pulses. no clubbing, cyanosis, significant edema, Musculoskeletal normal gait and posture. no significant deformity or arthritic changes, no loss or range of motion, no clubbing. DONEVA, PORTILLA ([id number]) [id number] Page 6 of 8 Psychiatric this patient is able to make decisions and demonstrates good insight into disease process. Alert and Oriented x 3. pleasant and cooperative. General Notes: Upon inspection patient's wound bed actually showed signs of good granulation and epithelization at this point. Fortunately I do not see any signs of active infection locally nor systemically which is great news and in general I do believe that we are moving in the right direction here. No fevers, chills, nausea, vomiting, or diarrhea. I did have to perform some sharp  debridement due to some slough and biofilm buildup on the surface of the wound she tolerated this debridement today without complication and postdebridement the wound bed appears to be doing much better which is great news. Overall I am extremely pleased with what we are seeing I think she is actually pretty close to complete resolution. Integumentary (Hair, Skin) Wound #1 status is Open. Original cause of wound was Trauma. The date acquired was: 12/13/2022. The wound is located on the Right,Lateral Foot. The wound measures 1cm length x 1.2cm width x 0.3cm depth; 0.942cm^2 area and 0.283cm^3 volume. There is Fat Layer (Subcutaneous Tissue) exposed. There is no tunneling or undermining noted. There is a medium amount of serosanguineous drainage noted. There is small (1-33%) pink granulation within the wound bed. There is a large (67-100%) amount  of necrotic tissue within the wound bed including Eschar and Adherent Slough. The periwound skin appearance had no abnormalities noted for texture. The periwound skin appearance had no abnormalities noted for moisture. The periwound skin appearance had no abnormalities noted for color. Periwound temperature was noted as No Abnormality. Assessment Active Problems ICD-10 Cutaneous abscess of right foot Non-pressure chronic ulcer of other part of right foot with fat layer exposed Essential (primary) hypertension Procedures Wound #1 Pre-procedure diagnosis of Wound #1 is an Abscess located on the Right,Lateral Foot . There was a Excisional Skin/Subcutaneous Tissue Debridement with a total area of 0.94 sq cm performed by Lenda Kelp, PA. With the following instrument(s): Curette to remove Viable and Non-Viable tissue/material. Material removed includes Callus, Subcutaneous Tissue, Slough, and Biofilm after achieving pain control using Lidocaine 5% topical ointment. No specimens were taken. A time out was conducted at 09:02, prior to the start of the  procedure. A Minimum amount of bleeding was controlled with Pressure. The procedure was tolerated well with a pain level of 0 throughout and a pain level of 0 following the procedure. Post Debridement Measurements: 1cm length x 1.2cm width x 0.3cm depth; 0.283cm^3 volume. Character of Wound/Ulcer Post Debridement is improved. Post procedure Diagnosis Wound #1: Same as Pre-Procedure General Notes: Scribed for Allen Derry PA, by [employer]. Plan Follow-up Appointments: Return Appointment in 1 week. Allen Derry PA-C Room 7 or 9 Wednesday July 10th at 10:30am Other: - Keep taking the Probiotic for a couple of weeks, as so desired. Anesthetic: (In clinic) Topical Lidocaine 5% applied to wound bed Bathing/ Shower/ Hygiene: May shower and wash wound with soap and water. - Wash/shower and at the end of bathing, wash foot with Dial soap (Gold). On days when not changing wound dressing, during the shower, put a plastic bag on foot to keep foot dry. Off-Loading: Other: - Elevate feet when sitting (at heart level or above, as tolerated) WOUND #1: - Foot Wound Laterality: Right, Lateral Cleanser: Soap and Water Every Other Day/30 Days Discharge Instructions: May shower and wash wound with dial antibacterial soap and water prior to dressing change. Cleanser: Vashe 5.8 (oz) Every Other Day/30 Days Discharge Instructions: Cleanse the wound with Vashe prior to applying a clean dressing using gauze sponges, not tissue or cotton balls. Cleanser: Wound Cleanser (DME) (Generic) Every Other Day/30 Days Discharge Instructions: Cleanse the wound with wound cleanser prior to applying a clean dressing using gauze sponges, not tissue or cotton balls. Cleanser: Byram Ancillary Kit - 15 Day Supply Every Other Day/30 Days Discharge Instructions: Use supplies as instructed; Kit contains: (15) Saline Bullets; (15) 3x3 Gauze; 15 pr Gloves Peri-Wound Care: Skin Prep (DME) (Generic) Every Other Day/30 Days Discharge  Instructions: Use skin prep as directed Prim Dressing: Promogran Prisma Matrix, 4.34 (sq in) (silver collagen) (DME) (Generic) Every Other Day/30 Days ary Discharge Instructions: Moisten collagen with saline or hydrogel Secondary Dressing: Bordered Gauze, 2x3.75 in (DME) (Generic) Every Other Day/30 Days Discharge Instructions: Apply over primary dressing as directed. 1. I am good recommend at this point that we have the patient continue to monitor for any signs of infection or worsening. I do believe that she should continue to utilize the recommendation for dressing changes as initiated today recommend use Prisma followed by a bordered foam dressing to cover. 2. I am going to also can recommend the patient should continue to utilize as well a shoe that will rub on the side of this foot. She has been doing that  anyway and seems to be doing well in that regard. With that being said even the tissue I think could be fine as long as it is tied loosely and this would allow her to be MARGRETE, PINYAN ([id number]) [id number] Page 7 of 8 able to walk better without any complications or trouble. 3. I am also can recommend that the patient should continue to monitor for any signs of worsening infection obviously if anything changes she should let me know but honestly I think that we are probably pretty close to complete resolution. We will see patient back for reevaluation in 1 week here in the clinic. If anything worsens or changes patient will contact our office for additional recommendations. Electronic Signature(s) Signed: 03/23/2023 4:01:01 PM By: Allen Derry PA-C Entered By: Allen Derry on 03/23/2023 16:01:01 -------------------------------------------------------------------------------- HxROS Details Patient Name: Date of Service: Linda Braun, Linda N L. 03/23/2023 8:00 A M Medical Record Number: [id number] Patient Account Number: [id number] Date of Birth/Sex: Treating  RN: 1951/07/13 (72 y.o. Debara Pickett, Linda Braun Primary Care Provider: Georgann Housekeeper Other Clinician: Referring Provider: Treating Provider/Extender: Verlon Setting, Karrar Weeks in Treatment: 0 Information Obtained From Patient Integumentary (Skin) Complaints and Symptoms: Positive for: Wounds - Right Lateral foot-abscess Eyes Ear/Nose/Mouth/Throat Medical History: Past Medical History Notes: Left ear neuroma Cardiovascular Medical History: Positive for: Hypertension Musculoskeletal Medical History: Past Medical History Notes: meniscus tear Psychiatric Medical History: Past Medical History Notes: anxiety Immunizations Pneumococcal Vaccine: Received Pneumococcal Vaccination: No Implantable Devices Yes Hospitalization / Surgery History Type of Hospitalization/Surgery 2015 right knee arthroscopy 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) breast reduction surgery c-section Linda Braun, Linda Braun ([id number]) [id number] Page 8 of 8 tonsillectomy Family and Social History Unknown History: Yes; Former smoker - quit 1973; Alcohol Use: Never; Drug Use: No History; Caffeine Use: Never; Financial Concerns: No; Food, Clothing or Shelter Needs: No; Support System Lacking: No; Transportation Concerns: No Electronic Signature(s) Signed: 03/23/2023 3:56:42 PM By: Allen Derry PA-C Signed: 03/23/2023 4:48:32 PM By: Shawn Stall RN, BSN Signed: 03/23/2023 5:28:52 PM By: Karie Schwalbe RN Entered By: Karie Schwalbe on 03/23/2023 09:16:16 -------------------------------------------------------------------------------- SuperBill Details Patient Name: Date of Service: Linda Braun, Linda Braun 03/23/2023 Medical Record Number: [id number] Patient Account Number: [id number] Date of Birth/Sex: Treating RN: [birth year] (72 y.o. F) Primary Care Provider: Georgann Housekeeper Other Clinician: Referring Provider: Treating Provider/Extender: Verlon Setting, Karrar Weeks in Treatment: 0 Diagnosis Coding ICD-10 Codes Code Description [phone] Cutaneous abscess of right foot L97.512 Non-pressure chronic ulcer of other part of right foot with fat layer exposed I10 Essential (primary) hypertension Facility Procedures : CPT4 Code: [id number] Description: 99214 - WOUND CARE VISIT-LEV 4 EST PT Modifier: Quantity: 1 : CPT4 Code: [id number] Description: 11042 - DEB SUBQ TISSUE 20 SQ CM/< ICD-10 Diagnosis Description L97.512 Non-pressure chronic ulcer of other part of right foot with fat layer exposed Modifier: Quantity: 1 Physician Procedures : CPT4 Code Description Modifier [id number] WC PHYS LEVEL 3 NEW PT 25 ICD-10 Diagnosis Description L02.611 Cutaneous abscess of right foot L97.512 Non-pressure chronic ulcer of other part of right foot with fat layer exposed I10 Essential (primary)  hypertension Quantity: 1 : [id number] 11042 - WC PHYS SUBQ TISS 20 SQ CM ICD-10 Diagnosis Description L97.512 Non-pressure chronic ulcer of other part of right foot with fat layer exposed Quantity: 1 Electronic Signature(s) Signed: 03/23/2023 5:28:52 PM By: Karie Schwalbe RN Signed: 03/25/2023 8:57:19 AM By: Allen Derry PA-C Previous Signature: 03/23/2023 4:01:16 PM Version By: Allen Derry PA-C Entered  By: Karie Schwalbe on 03/23/2023 17:27:05

## 2023-03-30 ENCOUNTER — Encounter (HOSPITAL_BASED_OUTPATIENT_CLINIC_OR_DEPARTMENT_OTHER): Payer: Medicare Other | Admitting: Physician Assistant

## 2023-03-30 DIAGNOSIS — L97512 Non-pressure chronic ulcer of other part of right foot with fat layer exposed: Secondary | ICD-10-CM | POA: Diagnosis not present

## 2023-03-30 NOTE — Progress Notes (Addendum)
GEORGEANN, BRINKMAN ([id number]) [id number] Page 1 of 7 Visit Report for 03/30/2023 Chief Complaint Document Details Patient Name: Date of Service: Linda Braun 03/30/2023 10:30 A M Medical Record Number: [id number] Patient Account Number: [id number] Date of Birth/Sex: Treating RN: 04-22-1951 (72 y.o. F) Primary Care Provider: Georgann Housekeeper Other Clinician: Referring Provider: Treating Provider/Extender: Verlon Setting, Karrar Weeks in Treatment: 1 Information Obtained from: Patient Chief Complaint Right lateral foot ulcer/abscess Electronic Signature(s) Signed: 03/30/2023 11:02:51 AM By: Allen Derry PA-C Entered By: Allen Derry on 03/30/2023 11:02:51 -------------------------------------------------------------------------------- Debridement Details Patient Name: Date of Service: [name], [name] 03/30/2023 10:30 A M Medical Record Number: [id number] Patient Account Number: [id number] Date of Birth/Sex: Treating RN: [birth year] (72 y.o. [name] Primary Care Provider: Georgann Housekeeper Other Clinician: Referring Provider: Treating Provider/Extender: Verlon Setting, Karrar Weeks in Treatment: 1 Debridement Performed for Assessment: Wound #1 Right,Lateral Foot Performed By: Physician Lenda Kelp, PA Debridement Type: Debridement Level of Consciousness (Pre-procedure): Awake and Alert Pre-procedure Verification/Time Out Yes - 12:05 Taken: Start Time: 12:05 Pain Control: Lidocaine 5% topical ointment Percent of Wound Bed Debrided: 100% T Area Debrided (cm): otal 0.02 Tissue and other material debrided: Viable, Non-Viable, Slough, Subcutaneous, Slough Level: Skin/Subcutaneous Tissue Debridement Description: Excisional Instrument: Curette Bleeding: Minimum Hemostasis Achieved: Pressure End Time: 12:07 Procedural Pain: 0 Post Procedural Pain: 0 Response to Treatment: Procedure was tolerated well Level of Consciousness  (Post- Awake and Alert procedure): Post Debridement Measurements of Total Wound Length: (cm) 0.1 Width: (cm) 0.3 Depth: (cm) 0.2 Volume: (cm) 0.005 Character of Wound/Ulcer Post Debridement: Improved KAYLEANA, WAITES ([id number]) [id number] Page 2 of 7 Post Procedure Diagnosis Same as Pre-procedure Notes Scribed for Allen Derry PA, by [employer]) Signed: 03/30/2023 2:13:40 PM By: Karie Schwalbe RN Signed: 03/30/2023 5:48:19 PM By: Allen Derry PA-C Entered By: Karie Schwalbe on 03/30/2023 12:15:56 -------------------------------------------------------------------------------- HPI Details Patient Name: Date of Service: [name], [name] 03/30/2023 10:30 A M Medical Record Number: [id number] Patient Account Number: [id number] Date of Birth/Sex: Treating RN: 1951-05-26 (72 y.o. F) Primary Care Provider: Georgann Housekeeper Other Clinician: Referring Provider: Treating Provider/Extender: Verlon Setting, Karrar Weeks in Treatment: 1 History of Present Illness HPI Description: 03-23-2023 upon evaluation today patient appears to be doing somewhat poorly in regard to her wound on the lateral aspect of her foot. She tells me that somewhere around March around the middle she had something that shattered in her house this was glass and she went into cleaning up she was barefoot. Subsequently she ended up getting a shard of glass in her right lateral foot at first she did not notice this and then has things went over the next week or so she began to have an issue with this region. She originally somewhere around the middle of April went to dermatology where they attempted to debride and remove anything that was fairly good x-ray that showed nothing that was remaining in the foot they felt they gotten all. Subsequently she ended up having this worsen yet again and it was around this time that eventually they ordered an MRI as well as make an ID  referral due to concerns about the x- ray showing osteomyelitis or at least a possibility of this. It ended up that the MRI showed no evidence of osteomyelitis which is great news. Nonetheless at this point the Levaquin as well as the doxycycline was stopped she had been on this for quite a while. It  was also at this point that she was referred to me for further evaluation and treatment due to this wound. Patient does have a history of hypertension but really no other major medical problems she typically is not an individual to take a lot of medications and she appears to be in very good health. 03-30-2023 upon evaluation today patient appears to be doing well currently in regard to her wounds. In fact the wound seems to be doing significantly better which is great news. Fortunately I do not see any signs of active infection locally or systemically which is great news and in general I do believe that she is making really good progress towards complete closure. In fact in just 1 week's time this has dramatically decreased in size and again is much healthier appearing. Electronic Signature(s) Signed: 03/30/2023 1:44:27 PM By: Allen Derry PA-C Entered By: Allen Derry on 03/30/2023 13:44:27 -------------------------------------------------------------------------------- Physical Exam Details Patient Name: Date of Service: STEV ENS, Harvie Heck 03/30/2023 10:30 A M Medical Record Number: [id number] Patient Account Number: [id number] Date of Birth/Sex: Treating RN: May 23, 1951 (72 y.o. F) Primary Care Provider: Georgann Housekeeper Other Clinician: Referring Provider: Treating Provider/Extender: Verlon Setting, Karrar Weeks in Treatment: 1 Constitutional Well-nourished and well-hydrated in no acute distress. Respiratory normal breathing without difficulty. KHUSHBOO, CHUCK ([id number]) [id number] Page 3 of 7 Psychiatric this patient is able to make decisions and  demonstrates good insight into disease process. Alert and Oriented x 3. pleasant and cooperative. Notes Upon inspection patient's wound bed actually showed signs elation and epithelization at this point. Fortunately I do not see any signs of worsening I did perform a very light debridement to clearway the necrotic tissue around the edges and in the center part of the wound. She tolerated that today without complication postdebridement this is significantly improved. Electronic Signature(s) Signed: 03/30/2023 1:44:59 PM By: Allen Derry PA-C Entered By: Allen Derry on 03/30/2023 13:44:59 -------------------------------------------------------------------------------- Physician Orders Details Patient Name: Date of Service: [name], [name] 03/30/2023 10:30 A M Medical Record Number: [id number] Patient Account Number: [id number] Date of Birth/Sex: Treating RN: May 31, 1951 (72 y.o. [name] Primary Care Provider: Georgann Housekeeper Other Clinician: Referring Provider: Treating Provider/Extender: Verlon Setting, Karrar Weeks in Treatment: 1 Verbal / Phone Orders: No Diagnosis Coding ICD-10 Coding Code Description L02.611 Cutaneous abscess of right foot L97.512 Non-pressure chronic ulcer of other part of right foot with fat layer exposed I10 Essential (primary) hypertension Follow-up Appointments ppointment in 1 week. - Dr. Mikey Bussing Rm9 Monday July 15th at 3:45pm Return A Other: - Dr. Baltazar Najjar July 24 th at 2pm Room 9 Anesthetic (In clinic) Topical Lidocaine 5% applied to wound bed Bathing/ Shower/ Hygiene May shower with protection but do not get wound dressing(s) wet. Protect dressing(s) with water repellant cover (for example, large plastic bag) or a cast cover and may then take shower. - Cast Protector can be purchased from [employer], [employer]. Cost $17-$40 May shower and wash wound with soap and water. - Wash/shower and at the end of bathing, wash foot with  Dial soap (Gold). On days when not changing wound dressing, during the shower, keep foot dry. Off-Loading Other: - Elevate feet when sitting (at heart level or above, as tolerated) Wound Treatment Wound #1 - Foot Wound Laterality: Right, Lateral Cleanser: Soap and Water 3 x Per Week/30 Days Discharge Instructions: May shower and wash wound with dial antibacterial soap and water prior to dressing change. Cleanser: Vashe 5.8 (oz) 3 x  Per Week/30 Days Discharge Instructions: Cleanse the wound with Vashe prior to applying a clean dressing using gauze sponges, not tissue or cotton balls. Peri-Wound Care: Skin Prep 3 x Per Week/30 Days Discharge Instructions: Use skin prep around wound before applying border gauze Peri-Wound Care: Adhesive Remover 3 x Per Week/30 Days Discharge Instructions: apply to around wound to remove adhesive Prim Dressing: Promogran Prisma Matrix, 4.34 (sq in) (silver collagen) 3 x Per Week/30 Days ary Discharge Instructions: Moisten collagen with saline or hydrogel Prim Dressing: Xeroform Occlusive Gauze Dressing, 4x4 in ary 3 x Per Week/30 Days Discharge Instructions: Apply over Prisma Prim Dressing: Netting #5 3 x Per Week/30 Days ary Discharge Instructions: or Use Over Border Gauze instead of Soft tape ZYIA, KANEKO ([id number]) [id number] Page 4 of 7 Secondary Dressing: ALLEVYN Gentle Border, 3x3 (in/in) 3 x Per Week/30 Days Discharge Instructions: Apply over Prisma+Xeroform Secured With: 67M Medipore Soft Cloth Surgical T 2x10 (in/yd) 3 x Per Week/30 Days ape Discharge Instructions: Secure with tape as desired. Electronic Signature(s) Signed: 03/30/2023 2:13:40 PM By: Karie Schwalbe RN Signed: 03/30/2023 5:48:19 PM By: Allen Derry PA-C Entered By: Karie Schwalbe on 03/30/2023 13:13:05 -------------------------------------------------------------------------------- Problem List Details Patient Name: Date of Service: [name],  [name] 03/30/2023 10:30 A M Medical Record Number: [id number] Patient Account Number: [id number] Date of Birth/Sex: Treating RN: 10-21-1950 (72 y.o. F) Primary Care Provider: Georgann Housekeeper Other Clinician: Referring Provider: Treating Provider/Extender: Verlon Setting, Karrar Weeks in Treatment: 1 Active Problems ICD-10 Encounter Code Description Active Date MDM Diagnosis L02.611 Cutaneous abscess of right foot 03/23/2023 No Yes L97.512 Non-pressure chronic ulcer of other part of right foot with fat layer exposed 03/23/2023 No Yes I10 Essential (primary) hypertension 03/23/2023 No Yes Inactive Problems Resolved Problems Electronic Signature(s) Signed: 03/30/2023 11:02:45 AM By: Allen Derry PA-C Entered By: Allen Derry on 03/30/2023 11:02:45 -------------------------------------------------------------------------------- Progress Note Details Patient Name: Date of Service: STEV ENS, [name] 03/30/2023 10:30 A M Medical Record Number: [id number] Patient Account Number: [id number] Date of Birth/Sex: Treating RN: November 02, 1950 (72 y.o. F) Primary Care Provider: Georgann Housekeeper Other Clinician: Referring Provider: Treating Provider/Extender: Verlon Setting, Karrar Weeks in Treatment: 1 Zebulon, Charm Barges ([id number]) [id number] Page 5 of 7 Subjective Chief Complaint Information obtained from Patient Right lateral foot ulcer/abscess History of Present Illness (HPI) 03-23-2023 upon evaluation today patient appears to be doing somewhat poorly in regard to her wound on the lateral aspect of her foot. She tells me that somewhere around March around the middle she had something that shattered in her house this was glass and she went into cleaning up she was barefoot. Subsequently she ended up getting a shard of glass in her right lateral foot at first she did not notice this and then has things went over the next week or so she began to have an issue with  this region. She originally somewhere around the middle of April went to dermatology where they attempted to debride and remove anything that was fairly good x-ray that showed nothing that was remaining in the foot they felt they gotten all. Subsequently she ended up having this worsen yet again and it was around this time that eventually they ordered an MRI as well as make an ID referral due to concerns about the x-ray showing osteomyelitis or at least a possibility of this. It ended up that the MRI showed no evidence of osteomyelitis which is great news. Nonetheless at this point the Levaquin as well as  the doxycycline was stopped she had been on this for quite a while. It was also at this point that she was referred to me for further evaluation and treatment due to this wound. Patient does have a history of hypertension but really no other major medical problems she typically is not an individual to take a lot of medications and she appears to be in very good health. 03-30-2023 upon evaluation today patient appears to be doing well currently in regard to her wounds. In fact the wound seems to be doing significantly better which is great news. Fortunately I do not see any signs of active infection locally or systemically which is great news and in general I do believe that she is making really good progress towards complete closure. In fact in just 1 week's time this has dramatically decreased in size and again is much healthier appearing. Objective Constitutional Well-nourished and well-hydrated in no acute distress. Vitals Time Taken: 11:07 AM, Height: 63 in, Weight: 154 lbs, BMI: 27.3, Temperature: 98.2 F, Pulse: 71 bpm, Respiratory Rate: 18 breaths/min, Blood Pressure: 122/76 mmHg. Respiratory normal breathing without difficulty. Psychiatric this patient is able to make decisions and demonstrates good insight into disease process. Alert and Oriented x 3. pleasant and cooperative. General  Notes: Upon inspection patient's wound bed actually showed signs elation and epithelization at this point. Fortunately I do not see any signs of worsening I did perform a very light debridement to clearway the necrotic tissue around the edges and in the center part of the wound. She tolerated that today without complication postdebridement this is significantly improved. Integumentary (Hair, Skin) Wound #1 status is Open. Original cause of wound was Trauma. The date acquired was: 12/13/2022. The wound has been in treatment 1 weeks. The wound is located on the Right,Lateral Foot. The wound measures 0.1cm length x 0.3cm width x 0.2cm depth; 0.024cm^2 area and 0.005cm^3 volume. There is Fat Layer (Subcutaneous Tissue) exposed. There is no tunneling or undermining noted. There is a medium amount of serosanguineous drainage noted. There is large (67- 100%) pink granulation within the wound bed. There is no necrotic tissue within the wound bed. The periwound skin appearance had no abnormalities noted for moisture. The periwound skin appearance had no abnormalities noted for color. The periwound skin appearance exhibited: Callus. The periwound skin appearance did not exhibit: Crepitus, Excoriation, Induration, Rash, Scarring. Periwound temperature was noted as No Abnormality. Assessment Active Problems ICD-10 Cutaneous abscess of right foot Non-pressure chronic ulcer of other part of right foot with fat layer exposed Essential (primary) hypertension Procedures Wound #1 Pre-procedure diagnosis of Wound #1 is an Abscess located on the Right,Lateral Foot . There was a Excisional Skin/Subcutaneous Tissue Debridement with a total area of 0.02 sq cm performed by Lenda Kelp, PA. With the following instrument(s): Curette to remove Viable and Non-Viable tissue/material. Material removed includes Subcutaneous Tissue and Slough and after achieving pain control using Lidocaine 5% topical ointment. No specimens  were taken. A time out was conducted at 12:05, prior to the start of the procedure. A Minimum amount of bleeding was controlled with Pressure. The procedure was tolerated well with a pain level of 0 throughout and a pain level of 0 following the procedure. Post Debridement Measurements: 0.1cm length x 0.3cm width x 0.2cm depth; IVONNE, [place] ([id number]) [id number] Page 6 of 7 0.005cm^3 volume. Character of Wound/Ulcer Post Debridement is improved. Post procedure Diagnosis Wound #1: Same as Pre-Procedure General Notes: Scribed for Allen Derry PA, by J.[employer].  Plan Follow-up Appointments: Return Appointment in 1 week. - Dr. Mikey Bussing Rm9 Monday July 15th at 3:45pm Other: - Dr. Baltazar Najjar July 24 th at 2pm Room 9 Anesthetic: (In clinic) Topical Lidocaine 5% applied to wound bed Bathing/ Shower/ Hygiene: May shower with protection but do not get wound dressing(s) wet. Protect dressing(s) with water repellant cover (for example, large plastic bag) or a cast cover and may then take shower. - Cast Protector can be purchased from [employer], [employer]. Cost $17-$40 May shower and wash wound with soap and water. - Wash/shower and at the end of bathing, wash foot with Dial soap (Gold). On days when not changing wound dressing, during the shower, keep foot dry. Off-Loading: Other: - Elevate feet when sitting (at heart level or above, as tolerated) WOUND #1: - Foot Wound Laterality: Right, Lateral Cleanser: Soap and Water 3 x Per Week/30 Days Discharge Instructions: May shower and wash wound with dial antibacterial soap and water prior to dressing change. Cleanser: Vashe 5.8 (oz) 3 x Per Week/30 Days Discharge Instructions: Cleanse the wound with Vashe prior to applying a clean dressing using gauze sponges, not tissue or cotton balls. Peri-Wound Care: Skin Prep 3 x Per Week/30 Days Discharge Instructions: Use skin prep around wound before applying border  gauze Peri-Wound Care: Adhesive Remover 3 x Per Week/30 Days Discharge Instructions: apply to around wound to remove adhesive Prim Dressing: Promogran Prisma Matrix, 4.34 (sq in) (silver collagen) 3 x Per Week/30 Days ary Discharge Instructions: Moisten collagen with saline or hydrogel Prim Dressing: Xeroform Occlusive Gauze Dressing, 4x4 in 3 x Per Week/30 Days ary Discharge Instructions: Apply over Prisma Prim Dressing: Netting #5 3 x Per Week/30 Days ary Discharge Instructions: or Use Over Border Gauze instead of Soft tape Secondary Dressing: ALLEVYN Gentle Border, 3x3 (in/in) 3 x Per Week/30 Days Discharge Instructions: Apply over Prisma+Xeroform Secured With: 82M Medipore Soft Cloth Surgical T 2x10 (in/yd) 3 x Per Week/30 Days ape Discharge Instructions: Secure with tape as desired. 1. I would recommend that we have the patient continue with the collagen although I think she just needs a very small amount of this to put in at this point. 2. I am also can recommend that the patient should continue to try to keep this from drying out I think Xeroform gauze applied over top of the collagen would be good followed by the bordered foam dressing to cover. 3. We can use netting to try to hold this in place so that she does not need the tape as that causes irritation of her skin but she may still need tape to reinforce. 4. I am good recommend as well that she should continue to monitor for any signs of any worsening but right now I really think she is moving in the right direction this is very close to resolution in my opinion. We will see patient back for reevaluation in 1 week here in the clinic. If anything worsens or changes patient will contact our office for additional recommendations. Electronic Signature(s) Signed: 03/30/2023 1:45:49 PM By: Allen Derry PA-C Entered By: Allen Derry on 03/30/2023  13:45:49 -------------------------------------------------------------------------------- SuperBill Details Patient Name: Date of Service: [name], SUSA [name] 03/30/2023 Medical Record Number: [id number] Patient Account Number: [id number] Date of Birth/Sex: Treating RN: February 27, 1951 (72 y.o. F) Primary Care Provider: Georgann Housekeeper Other Clinician: Referring Provider: Treating Provider/Extender: Verlon Setting, Karrar Weeks in Treatment: 1 Diagnosis Coding ICD-10 Codes Code Description SENNA, LAPE ([id number]) [id number] Page 7 of 7  L02.611 Cutaneous abscess of right foot L97.512 Non-pressure chronic ulcer of other part of right foot with fat layer exposed I10 Essential (primary) hypertension Facility Procedures : CPT4 Code: [id number] Description: 11042 - DEB SUBQ TISSUE 20 SQ CM/< ICD-10 Diagnosis Description L97.512 Non-pressure chronic ulcer of other part of right foot with fat layer exposed Modifier: Quantity: 1 Physician Procedures : CPT4 Code Description Modifier [id number] 11042 - WC PHYS SUBQ TISS 20 SQ CM ICD-10 Diagnosis Description L97.512 Non-pressure chronic ulcer of other part of right foot with fat layer exposed Quantity: 1 Electronic Signature(s) Signed: 03/30/2023 1:46:24 PM By: Allen Derry PA-C Entered By: Allen Derry on 03/30/2023 13:46:24

## 2023-04-01 NOTE — Progress Notes (Signed)
ANDROMEDA, GREN ([id number]) [id number] Page 1 of 6 Visit Report for 03/30/2023 Arrival Information Details Patient Name: Date of Service: Linda Braun 03/30/2023 10:30 A M Medical Record Number: [id number] Patient Account Number: [id number] Date of Birth/Sex: Treating RN: [birth year] (72 y.o. F) Primary Care Tierre Gerard: Georgann Housekeeper Other Clinician: Referring Tramar Brueckner: Treating Kimberlie Csaszar/Extender: Verlon Setting, Karrar Weeks in Treatment: 1 Visit Information History Since Last Visit Added or deleted any medications: No Patient Arrived: Ambulatory Any new allergies or adverse reactions: No Arrival Time: 11:07 Had a fall or experienced change in No Accompanied By: self activities of daily living that may affect Transfer Assistance: None risk of falls: Patient Identification Verified: Yes Signs or symptoms of abuse/neglect since last visito No Secondary Verification Process Completed: Yes Hospitalized since last visit: No Patient Requires Transmission-Based Precautions: No Implantable device outside of the clinic excluding No cellular tissue based products placed in the center since last visit: Has Dressing in Place as Prescribed: Yes Pain Present Now: No Electronic Signature(s) Signed: 04/01/2023 1:10:34 PM By: Thayer Dallas Entered By: Thayer Dallas on 03/30/2023 11:07:32 -------------------------------------------------------------------------------- Encounter Discharge Information Details Patient Name: Date of Service: Linda Braun, Linda N L. 03/30/2023 10:30 A M Medical Record Number: [id number] Patient Account Number: [id number] Date of Birth/Sex: Treating RN: August 23, 1951 (72 y.o. Linda Braun Primary Care Destine Ambroise: Georgann Housekeeper Other Clinician: Referring Dustee Bottenfield: Treating Wasif Simonich/Extender: Verlon Setting, Karrar Weeks in Treatment: 1 Encounter Discharge Information Items Post Procedure Vitals Discharge Condition:  Stable Temperature (F): 98.2 Ambulatory Status: Ambulatory Pulse (bpm): 71 Discharge Destination: Home Respiratory Rate (breaths/min): 18 Transportation: Private Auto Blood Pressure (mmHg): 122/76 Accompanied By: self Schedule Follow-up Appointment: Yes Clinical Summary of Care: Patient Declined Notes Patient brought herself to the appointment. Electronic Signature(s) Signed: 03/30/2023 2:13:40 PM By: Karie Schwalbe RN Entered By: Karie Schwalbe on 03/30/2023 14:11:25 Linda Braun ([id number]) [id number] Page 2 of 6 -------------------------------------------------------------------------------- Lower Extremity Assessment Details Patient Name: Date of Service: Linda Braun 03/30/2023 10:30 A M Medical Record Number: [id number] Patient Account Number: [id number] Date of Birth/Sex: Treating RN: 1951/04/29 (72 y.o. F) Primary Care Wayman Hoard: Georgann Housekeeper Other Clinician: Referring Zyasia Halbleib: Treating Kiauna Zywicki/Extender: Verlon Setting, Karrar Weeks in Treatment: 1 Edema Assessment Assessed: [Left: No] [Right: No] [Left: Edema] [Right: :] Calf Left: Right: Point of Measurement: From Medial Instep 36 cm Ankle Left: Right: Point of Measurement: From Medial Instep 20 cm Vascular Assessment Extremity colors, hair growth, and conditions: Extremity Color: [Right:Normal] Hair Growth on Extremity: [Right:No] Temperature of Extremity: [Right:Warm] Capillary Refill: [Right:< 3 seconds No] Toe Nail Assessment Left: Right: Thick: No Discolored: No Deformed: No Improper Length and Hygiene: No Electronic Signature(s) Signed: 04/01/2023 1:10:34 PM By: Thayer Dallas Entered By: Thayer Dallas on 03/30/2023 11:24:10 -------------------------------------------------------------------------------- Multi-Disciplinary Care Plan Details Patient Name: Date of Service: Linda Braun, Linda N L. 03/30/2023 10:30 A M Medical Record Number:  [id number] Patient Account Number: [id number] Date of Birth/Sex: Treating RN: 12-19-1950 (72 y.o. Linda Braun Primary Care Swan [place]: Georgann Housekeeper Other Clinician: Referring Dayveon Halley: Treating Nashayla Telleria/Extender: Verlon Setting, Karrar Weeks in Treatment: 1 Active Inactive Wound/Skin Impairment Nursing Diagnoses: Impaired tissue integrity Linda Braun, Linda Braun ([id number]) [phone].pdf Page 3 of 6 Goals: Patient/caregiver will verbalize understanding of skin care regimen Date Initiated: 03/23/2023 Target Resolution Date: 05/20/2025 Goal Status: Active Interventions: Assess ulceration(s) every visit Treatment Activities: Skin care regimen initiated : 03/23/2023 Notes: Electronic Signature(s) Signed: 03/30/2023 2:13:40 PM By: Karie Schwalbe RN Entered By: Karie Schwalbe on  03/30/2023 14:09:27 -------------------------------------------------------------------------------- Pain Assessment Details Patient Name: Date of Service: Linda Braun 03/30/2023 10:30 A M Medical Record Number: [id number] Patient Account Number: [id number] Date of Birth/Sex: Treating RN: 25-Dec-1950 (72 y.o. F) Primary Care Brystol Wasilewski: Georgann Housekeeper Other Clinician: Referring Dalia Jollie: Treating Ennio Houp/Extender: Verlon Setting, Karrar Weeks in Treatment: 1 Active Problems Location of Pain Severity and Description of Pain Patient Has Paino No Site Locations Pain Management and Medication Current Pain Management: Electronic Signature(s) Signed: 04/01/2023 1:10:34 PM By: Thayer Dallas Entered By: Thayer Dallas on 03/30/2023 11:09:37 Linda Braun ([id number]) [id number] Page 4 of 6 -------------------------------------------------------------------------------- Patient/Caregiver Education Details Patient Name: Date of Service: Linda Braun 7/10/2024andnbsp10:30 A M Medical Record Number: [id number] Patient Account Number:  [id number] Date of Birth/Gender: Treating RN: 1951/03/04 (72 y.o. Linda Braun Primary Care Physician: Georgann Housekeeper Other Clinician: Referring Physician: Treating Physician/Extender: Verlon Setting, Derald Macleod in Treatment: 1 Education Assessment Education Provided To: Patient Education Topics Provided Wound/Skin Impairment: Methods: Demonstration, Explain/Verbal Responses: State content correctly Electronic Signature(s) Signed: 03/30/2023 2:13:40 PM By: Karie Schwalbe RN Entered By: Karie Schwalbe on 03/30/2023 14:09:45 -------------------------------------------------------------------------------- Wound Assessment Details Patient Name: Date of Service: Linda Braun, Linda N L. 03/30/2023 10:30 A M Medical Record Number: [id number] Patient Account Number: [id number] Date of Birth/Sex: Treating RN: 1951/08/27 (72 y.o. F) Primary Care Nisreen Guise: Georgann Housekeeper Other Clinician: Referring Hillery Zachman: Treating Shalita Notte/Extender: Verlon Setting, Karrar Weeks in Treatment: 1 Wound Status Wound Number: 1 Primary Etiology: Abscess Wound Location: Right, Lateral Foot Wound Status: Open Wounding Event: Trauma Comorbid History: Hypertension Date Acquired: 12/13/2022 Weeks Of Treatment: 1 Clustered Wound: No Photos Wound Measurements Length: (cm) 0.1 Width: (cm) 0.3 Depth: (cm) 0.2 Area: (cm) 0. Volume: (cm) 0. % Reduction in Area: 97.5% % Reduction in Volume: 98.2% Epithelialization: Small (1-33%) 024 Tunneling: No 005 Undermining: No Wound Description Braun, Linda L ([id number]) Classification: Full Thickness Without Exposed Support Structures Exudate Amount: Medium Exudate Type: Serosanguineous Exudate Color: red, brown [id number] Page 5 of 6 Foul Odor After Cleansing: No Slough/Fibrino Yes Wound Bed Granulation Amount: Large (67-100%) Exposed Structure Granulation Quality: Pink Fascia Exposed: No Necrotic Amount: None  Present (0%) Fat Layer (Subcutaneous Tissue) Exposed: Yes Tendon Exposed: No Muscle Exposed: No Joint Exposed: No Bone Exposed: No Periwound Skin Texture Texture Color No Abnormalities Noted: No No Abnormalities Noted: Yes Callus: Yes Temperature / Pain Crepitus: No Temperature: No Abnormality Excoriation: No Induration: No Rash: No Scarring: No Moisture No Abnormalities Noted: Yes Treatment Notes Wound #1 (Foot) Wound Laterality: Right, Lateral Cleanser Soap and Water Discharge Instruction: May shower and wash wound with dial antibacterial soap and water prior to dressing change. Vashe 5.8 (oz) Discharge Instruction: Cleanse the wound with Vashe prior to applying a clean dressing using gauze sponges, not tissue or cotton balls. Peri-Wound Care Skin Prep Discharge Instruction: Use skin prep around wound before applying border gauze Adhesive Remover Discharge Instruction: apply to around wound to remove adhesive Topical Primary Dressing Promogran Prisma Matrix, 4.34 (sq in) (silver collagen) Discharge Instruction: Moisten collagen with saline or hydrogel Xeroform Occlusive Gauze Dressing, 4x4 in Discharge Instruction: Apply over Prisma Netting #5 Discharge Instruction: or Use Over Border Gauze instead of Soft tape Secondary Dressing ALLEVYN Gentle Border, 3x3 (in/in) Discharge Instruction: Apply over Prisma+Xeroform Secured With 36M Medipore Soft Cloth Surgical T 2x10 (in/yd) ape Discharge Instruction: Secure with tape as desired. Compression Wrap Compression Stockings Add-Ons Electronic Signature(s) Signed: 04/01/2023 1:10:34 PM By: Thayer Dallas Entered By: Louanne Skye,  Cala Bradford on 03/30/2023 11:25:31 Linda Braun, Linda Braun ([id number]) [phone].pdf Page 6 of 6 -------------------------------------------------------------------------------- Vitals Details Patient Name: Date of Service: Linda Braun 03/30/2023 10:30 A M Medical Record Number:  [id number] Patient Account Number: [id number] Date of Birth/Sex: Treating RN: [birth year] (72 y.o. F) Primary Care Jassiah Viviano: Georgann Housekeeper Other Clinician: Referring Rhema Boyett: Treating Avea Mcgowen/Extender: Verlon Setting, Karrar Weeks in Treatment: 1 Vital Signs Time Taken: 11:07 Temperature (F): 98.2 Height (in): 63 Pulse (bpm): 71 Weight (lbs): 154 Respiratory Rate (breaths/min): 18 Body Mass Index (BMI): 27.3 Blood Pressure (mmHg): 122/76 Reference Range: 80 - 120 mg / dl Electronic Signature(s) Signed: 04/01/2023 1:10:34 PM By: Thayer Dallas Entered By: Thayer Dallas on 03/30/2023 11:09:29

## 2023-04-04 ENCOUNTER — Encounter (HOSPITAL_BASED_OUTPATIENT_CLINIC_OR_DEPARTMENT_OTHER): Payer: Medicare Other | Attending: Internal Medicine | Admitting: Internal Medicine

## 2023-04-04 DIAGNOSIS — L02611 Cutaneous abscess of right foot: Secondary | ICD-10-CM | POA: Diagnosis not present

## 2023-04-04 DIAGNOSIS — L97512 Non-pressure chronic ulcer of other part of right foot with fat layer exposed: Secondary | ICD-10-CM

## 2023-04-04 DIAGNOSIS — I10 Essential (primary) hypertension: Secondary | ICD-10-CM | POA: Insufficient documentation

## 2023-04-04 DIAGNOSIS — Z872 Personal history of diseases of the skin and subcutaneous tissue: Secondary | ICD-10-CM | POA: Insufficient documentation

## 2023-04-04 DIAGNOSIS — Z09 Encounter for follow-up examination after completed treatment for conditions other than malignant neoplasm: Secondary | ICD-10-CM | POA: Diagnosis not present

## 2023-04-04 DIAGNOSIS — M21611 Bunion of right foot: Secondary | ICD-10-CM | POA: Insufficient documentation

## 2023-04-05 NOTE — Progress Notes (Signed)
BRENNA, FRIESENHAHN ([id number]) [phone].pdf Page 1 of 8 Visit Report for 04/04/2023 Arrival Information Details Patient Name: Date of Service: Linda Braun 04/04/2023 3:45 PM Medical Record Number: [id number] Patient Account Number: [id number] Date of Birth/Sex: Treating RN: [birth year] (72 y.o. Linda Braun Ozell Ferrera: Georgann Housekeeper Other Clinician: Referring Calaya Gildner: Treating Dawnetta Copenhaver/Extender: Adline Mango in Treatment: 1 Visit Information History Since Last Visit Added or deleted any medications: No Patient Arrived: Ambulatory Any new allergies or adverse reactions: No Arrival Time: 16:05 Had a fall or experienced change in No Accompanied By: self activities of daily living that may affect Transfer Assistance: None risk of falls: Patient Identification Verified: Yes Signs or symptoms of abuse/neglect since last visito No Secondary Verification Process Completed: Yes Hospitalized since last visit: No Patient Requires Transmission-Based Precautions: No Implantable device outside of the clinic excluding No cellular tissue based products placed in the center since last visit: Has Dressing in Place as Prescribed: Yes Pain Present Now: No Electronic Signature(s) Signed: 04/04/2023 4:41:34 PM By: Samuella Bruin Entered By: Samuella Bruin on 04/04/2023 16:06:17 -------------------------------------------------------------------------------- Clinic Level of Braun Assessment Details Patient Name: Date of Service: Linda Braun 04/04/2023 3:45 PM Medical Record Number: [id number] Patient Account Number: [id number] Date of Birth/Sex: Treating RN: [birth year] (72 y.o. Linda Braun Rolande Moe: Georgann Housekeeper Other Clinician: Referring Meleena Munroe: Treating Ersilia Brawley/Extender: Adline Mango in Treatment: 1 Clinic Level of Braun Assessment Items TOOL 4 Quantity  Score X- 1 0 Use when only an EandM is performed on FOLLOW-UP visit ASSESSMENTS - Nursing Assessment / Reassessment []  - 0 Reassessment of Co-morbidities (includes updates in patient status) X- 1 5 Reassessment of Adherence to Treatment Plan ASSESSMENTS - Wound and Skin A ssessment / Reassessment X - Simple Wound Assessment / Reassessment - one wound 1 5 []  - 0 Complex Wound Assessment / Reassessment - multiple wounds []  - 0 Dermatologic / Skin Assessment (not related to wound area) ASSESSMENTS - Focused Assessment []  - 0 Circumferential Edema Measurements - multi extremities []  - 0 Nutritional Assessment / Counseling / Intervention Linda Braun, Linda Braun ([id number]) [phone].pdf Page 2 of 8 []  - 0 Lower Extremity Assessment (monofilament, tuning fork, pulses) []  - 0 Peripheral Arterial Disease Assessment (using hand held doppler) ASSESSMENTS - Ostomy and/or Continence Assessment and Braun []  - 0 Incontinence Assessment and Management []  - 0 Ostomy Braun Assessment and Management (repouching, etc.) PROCESS - Coordination of Braun X - Simple Patient / Family Education for ongoing Braun 1 15 []  - 0 Complex (extensive) Patient / Family Education for ongoing Braun X- 1 10 Staff obtains Chiropractor, Records, T Results / Process Orders est []  - 0 Staff telephones HHA, Nursing Homes / Clarify orders / etc []  - 0 Routine Transfer to another Facility (non-emergent condition) []  - 0 Routine Hospital Admission (non-emergent condition) []  - 0 New Admissions / Manufacturing engineer / Ordering NPWT Apligraf, etc. , []  - 0 Emergency Hospital Admission (emergent condition) X- 1 10 Simple Discharge Coordination []  - 0 Complex (extensive) Discharge Coordination PROCESS - Special Needs []  - 0 Pediatric / Minor Patient Management []  - 0 Isolation Patient Management []  - 0 Hearing / Language / Visual special needs []  - 0 Assessment of Community assistance  (transportation, D/C planning, etc.) []  - 0 Additional assistance / Altered mentation []  - 0 Support Surface(s) Assessment (bed, cushion, seat, etc.) INTERVENTIONS - Wound Cleansing / Measurement X - Simple Wound Cleansing - one wound 1  5 []  - 0 Complex Wound Cleansing - multiple wounds X- 1 5 Wound Imaging (photographs - any number of wounds) []  - 0 Wound Tracing (instead of photographs) X- 1 5 Simple Wound Measurement - one wound []  - 0 Complex Wound Measurement - multiple wounds INTERVENTIONS - Wound Dressings X - Small Wound Dressing one or multiple wounds 1 10 []  - 0 Medium Wound Dressing one or multiple wounds []  - 0 Large Wound Dressing one or multiple wounds []  - 0 Application of Medications - topical []  - 0 Application of Medications - injection INTERVENTIONS - Miscellaneous []  - 0 External ear exam []  - 0 Specimen Collection (cultures, biopsies, blood, body fluids, etc.) []  - 0 Specimen(s) / Culture(s) sent or taken to Lab for analysis []  - 0 Patient Transfer (multiple staff / Nurse, adult / Similar devices) []  - 0 Simple Staple / Suture removal (25 or less) []  - 0 Complex Staple / Suture removal (26 or more) []  - 0 Hypo / Hyperglycemic Management (close monitor of Blood Glucose) Linda Braun, Linda Braun ([id number]) [id number].pdf Page 3 of 8 []  - 0 Ankle / Brachial Index (ABI) - do not check if billed separately X- 1 5 Vital Signs Has the patient been seen at the hospital within the last three years: Yes Total Score: 75 Level Of Braun: New/Established - Level 2 Electronic Signature(s) Signed: 04/04/2023 4:41:34 PM By: Samuella Bruin Entered By: Samuella Bruin on 04/04/2023 16:26:34 -------------------------------------------------------------------------------- Encounter Discharge Information Details Patient Name: Date of Service: Linda Braun, Linda Heck. 04/04/2023 3:45 PM Medical Record Number: [id number] Patient Account Number:  [id number] Date of Birth/Sex: Treating RN: August 07, 1951 (71 y.o. Linda Braun Annette Bertelson: Georgann Housekeeper Other Clinician: Referring Shaleka Brines: Treating Traivon Morrical/Extender: Adline Mango in Treatment: 1 Encounter Discharge Information Items Discharge Condition: Stable Ambulatory Status: Ambulatory Discharge Destination: Home Transportation: Private Auto Accompanied By: self Schedule Follow-up Appointment: Yes Clinical Summary of Braun: Patient Declined Electronic Signature(s) Signed: 04/04/2023 4:41:34 PM By: Samuella Bruin Entered By: Samuella Bruin on 04/04/2023 16:27:01 -------------------------------------------------------------------------------- Lower Extremity Assessment Details Patient Name: Date of Service: Linda Braun 04/04/2023 3:45 PM Medical Record Number: [id number] Patient Account Number: [id number] Date of Birth/Sex: Treating RN: October 13, 1950 (72 y.o. Linda Braun Caiya Bettes: Georgann Housekeeper Other Clinician: Referring Adolfo Granieri: Treating Joylynn Defrancesco/Extender: Orvan Seen Weeks in Treatment: 1 Edema Assessment Assessed: [Left: No] [Right: No] [Left: Edema] [Right: :] Calf Left: Right: Point of Measurement: From Medial Instep 36 cm Ankle Left: Right: Point of Measurement: From Medial Instep 20 cm Vascular Assessment [place], Zea Braun ([id number]) [Right:[id number] Page 4 of 8] Extremity colors, hair growth, and conditions: Extremity Color: [Right:Normal] Hair Growth on Extremity: [Right:No] Temperature of Extremity: [Right:Warm < 3 seconds] Electronic Signature(s) Signed: 04/04/2023 4:41:34 PM By: Samuella Bruin Entered By: Samuella Bruin on 04/04/2023 16:06:49 -------------------------------------------------------------------------------- Multi Wound Chart Details Patient Name: Date of Service: Linda Braun, Linda [name]. 04/04/2023 3:45  PM Medical Record Number: [id number] Patient Account Number: [id number] Date of Birth/Sex: Treating RN: Jan 28, 1951 (72 y.o. F) Primary Braun Lissett Favorite: Georgann Housekeeper Other Clinician: Referring Irineo Gaulin: Treating Daylan Boggess/Extender: Orvan Seen Weeks in Treatment: 1 Vital Signs Height(in): 63 Pulse(bpm): 74 Weight(lbs): 154 Blood Pressure(mmHg): 116/69 Body Mass Index(BMI): 27.3 Temperature(F): 98.7 Respiratory Rate(breaths/min): 18 [1:Photos:] [N/A:N/A] Right, Lateral Foot N/A N/A Wound Location: Trauma N/A N/A Wounding Event: Abscess N/A N/A Primary Etiology: Hypertension N/A N/A Comorbid History: 12/13/2022 N/A N/A Date Acquired: 1 N/A N/A Weeks of Treatment: Open N/A N/A Wound  Status: No N/A N/A Wound Recurrence: 0.1x0.1x0.1 N/A N/A Measurements Braun x W x D (cm) 0.008 N/A N/A A (cm) : rea 0.001 N/A N/A Volume (cm) : 99.20% N/A N/A % Reduction in A rea: 99.60% N/A N/A % Reduction in Volume: Full Thickness Without Exposed N/A N/A Classification: Support Structures None Present N/A N/A Exudate Amount: Flat and Intact N/A N/A Wound Margin: None Present (0%) N/A N/A Granulation Amount: None Present (0%) N/A N/A Necrotic Amount: Fascia: No N/A N/A Exposed Structures: Fat Layer (Subcutaneous Tissue): No Tendon: No Muscle: No Joint: No Bone: No Large (67-100%) N/A N/A Epithelialization: Callus: Yes N/A N/A Periwound Skin Texture: Excoriation: No Induration: No Crepitus: No Rash: No Scarring: No Maceration: No N/A N/A Periwound Skin Moisture: Linda Braun, Linda Braun ([id number]) [id number] Page 5 of 8 Dry/Scaly: No Atrophie Blanche: No N/A N/A Periwound Skin Color: Cyanosis: No Ecchymosis: No Erythema: No Hemosiderin Staining: No Mottled: No Pallor: No Rubor: No No Abnormality N/A N/A Temperature: Treatment Notes Electronic Signature(s) Signed: 04/05/2023 4:51:26 PM By: Geralyn Corwin DO Entered By:  Geralyn Corwin on 04/04/2023 16:22:51 -------------------------------------------------------------------------------- Multi-Disciplinary Braun Plan Details Patient Name: Date of Service: Linda Braun, Linda [name]. 04/04/2023 3:45 PM Medical Record Number: [id number] Patient Account Number: [id number] Date of Birth/Sex: Treating RN: 10-12-1950 (72 y.o. Linda Braun Ashya Nicolaisen: Georgann Housekeeper Other Clinician: Referring Latresa Gasser: Treating Tobin Witucki/Extender: Orvan Seen Weeks in Treatment: 1 Active Inactive Wound/Skin Impairment Nursing Diagnoses: Impaired tissue integrity Goals: Patient/caregiver will verbalize understanding of skin Braun regimen Date Initiated: 03/23/2023 Target Resolution Date: 05/20/2025 Goal Status: Active Interventions: Assess ulceration(s) every visit Treatment Activities: Skin Braun regimen initiated : 03/23/2023 Notes: Electronic Signature(s) Signed: 04/04/2023 4:41:34 PM By: Samuella Bruin Entered By: Samuella Bruin on 04/04/2023 16:07:28 -------------------------------------------------------------------------------- Pain Assessment Details Patient Name: Date of Service: Linda Braun, Linda [name]. 04/04/2023 3:45 PM Medical Record Number: [id number] Patient Account Number: [id number] Date of Birth/Sex: Treating RN: September 24, 1950 (72 y.o. Linda Braun Laurine Kuyper: Georgann Housekeeper Other Clinician: Referring Ziana Heyliger: Treating Baili Stang/Extender: Adline Mango in Treatment: 1 Linda Braun, Linda Braun ([id number]) [id number] Page 6 of 8 Active Problems Location of Pain Severity and Description of Pain Patient Has Paino No Site Locations Rate the pain. Current Pain Level: 0 Pain Management and Medication Current Pain Management: Electronic Signature(s) Signed: 04/04/2023 4:41:34 PM By: Samuella Bruin Entered By: Samuella Bruin on 04/04/2023  16:06:46 -------------------------------------------------------------------------------- Patient/Caregiver Education Details Patient Name: Date of Service: Linda Braun 7/15/2024andnbsp3:45 PM Medical Record Number: [id number] Patient Account Number: [id number] Date of Birth/Gender: Treating RN: [birth year] (72 y.o. Linda Braun Physician: Georgann Housekeeper Other Clinician: Referring Physician: Treating Physician/Extender: Adline Mango in Treatment: 1 Education Assessment Education Provided To: Patient Education Topics Provided Wound/Skin Impairment: Methods: Explain/Verbal Responses: Reinforcements needed, State content correctly Electronic Signature(s) Signed: 04/04/2023 4:41:34 PM By: Samuella Bruin Entered By: Samuella Bruin on 04/04/2023 16:07:39 Linda Braun, Linda Braun ([id number]) [id number] Page 7 of 8 -------------------------------------------------------------------------------- Wound Assessment Details Patient Name: Date of Service: Linda Braun 04/04/2023 3:45 PM Medical Record Number: [id number] Patient Account Number: [id number] Date of Birth/Sex: Treating RN: 1951/07/18 (72 y.o. Linda Braun Naliyah Neth: Georgann Housekeeper Other Clinician: Referring Adda Stokes: Treating Quinley Nesler/Extender: Orvan Seen Weeks in Treatment: 1 Wound Status Wound Number: 1 Primary Etiology: Abscess Wound Location: Right, Lateral Foot Wound Status: Open Wounding Event: Trauma Comorbid History: Hypertension Date Acquired: 12/13/2022 Weeks Of Treatment: 1 Clustered Wound: No Photos Wound Measurements  Length: (cm) 0.1 Width: (cm) 0.1 Depth: (cm) 0.1 Area: (cm) 0.008 Volume: (cm) 0.001 % Reduction in Area: 99.2% % Reduction in Volume: 99.6% Epithelialization: Large (67-100%) Tunneling: No Undermining: No Wound Description Classification: Full Thickness Without  Exposed Support Structures Wound Margin: Flat and Intact Exudate Amount: None Present Foul Odor After Cleansing: No Slough/Fibrino No Wound Bed Granulation Amount: None Present (0%) Exposed Structure Necrotic Amount: None Present (0%) Fascia Exposed: No Fat Layer (Subcutaneous Tissue) Exposed: No Tendon Exposed: No Muscle Exposed: No Joint Exposed: No Bone Exposed: No Periwound Skin Texture Texture Color No Abnormalities Noted: No No Abnormalities Noted: Yes Callus: Yes Temperature / Pain Crepitus: No Temperature: No Abnormality Excoriation: No Induration: No Rash: No Scarring: No Moisture No Abnormalities Noted: Yes Treatment Notes Wound #1 (Foot) Wound Laterality: Right, Lateral Cleanser Soap and Water Discharge Instruction: May shower and wash wound with dial antibacterial soap and water prior to dressing change. Vashe 5.8 (oz) Linda Braun, Linda Braun ([id number]) [phone].pdf Page 8 of 8 Discharge Instruction: Cleanse the wound with Vashe prior to applying a clean dressing using gauze sponges, not tissue or cotton balls. Peri-Wound Braun Skin Prep Discharge Instruction: Use skin prep around wound before applying border gauze Adhesive Remover Discharge Instruction: apply to around wound to remove adhesive Topical Primary Dressing Netting #5 Discharge Instruction: or Use Over Border Gauze instead of Soft tape Secondary Dressing ALLEVYN Gentle Border, 3x3 (in/in) Discharge Instruction: Apply over Prisma+Xeroform Secured With 65M Medipore Soft Cloth Surgical T 2x10 (in/yd) ape Discharge Instruction: Secure with tape as desired. Compression Wrap Compression Stockings Add-Ons Electronic Signature(s) Signed: 04/04/2023 4:41:34 PM By: Samuella Bruin Entered By: Samuella Bruin on 04/04/2023 16:20:50 -------------------------------------------------------------------------------- Vitals Details Patient Name: Date of Service: Linda Braun, Linda N  Braun. 04/04/2023 3:45 PM Medical Record Number: [id number] Patient Account Number: [id number] Date of Birth/Sex: Treating RN: November 16, 1950 (72 y.o. Linda Braun Angeli Demilio: Georgann Housekeeper Other Clinician: Referring Caelie [place]: Treating Azir Muzyka/Extender: Orvan Seen Weeks in Treatment: 1 Vital Signs Time Taken: 16:06 Temperature (F): 98.7 Height (in): 63 Pulse (bpm): 74 Weight (lbs): 154 Respiratory Rate (breaths/min): 18 Body Mass Index (BMI): 27.3 Blood Pressure (mmHg): 116/69 Reference Range: 80 - 120 mg / dl Electronic Signature(s) Signed: 04/04/2023 4:41:34 PM By: Samuella Bruin Entered By: Samuella Bruin on 04/04/2023 16:06:36

## 2023-04-05 NOTE — Progress Notes (Addendum)
[name], [name] ([id number]) [id number] Page 1 of 7 Visit Report for 04/04/2023 Chief Complaint Document Details Patient Name: Date of Service: Linda Braun 04/04/2023 3:45 PM Medical Record Number: [id number] Patient Account Number: [id number] Date of Birth/Sex: Treating RN: [birth year] (72 y.o. F) Primary Care Provider: Georgann Housekeeper Other Clinician: Referring Provider: Treating Provider/Extender: Adline Mango in Treatment: 1 Information Obtained from: Patient Chief Complaint Right lateral foot ulcer/abscess Electronic Signature(s) Signed: 04/05/2023 4:51:26 PM By: Geralyn Corwin DO Entered By: Geralyn Corwin on 04/04/2023 16:22:58 -------------------------------------------------------------------------------- HPI Details Patient Name: Date of Service: Linda Braun. 04/04/2023 3:45 PM Medical Record Number: [id number] Patient Account Number: [id number] Date of Birth/Sex: Treating RN: 1951-02-17 (72 y.o. F) Primary Care Provider: Georgann Housekeeper Other Clinician: Referring Provider: Treating Provider/Extender: Adline Mango in Treatment: 1 History of Present Illness HPI Description: 03-23-2023 upon evaluation today patient appears to be doing somewhat poorly in regard to her wound on the lateral aspect of her foot. She tells me that somewhere around March around the middle she had something that shattered in her house this was glass and she went into cleaning up she was barefoot. Subsequently she ended up getting a shard of glass in her right lateral foot at first she did not notice this and then has things went over the next week or so she began to have an issue with this region. She originally somewhere around the middle of April went to dermatology where they attempted to debride and remove anything that was fairly good x-ray that showed nothing that was remaining in the foot they felt they  gotten all. Subsequently she ended up having this worsen yet again and it was around this time that eventually they ordered an MRI as well as make an ID referral due to concerns about the x- ray showing osteomyelitis or at least a possibility of this. It ended up that the MRI showed no evidence of osteomyelitis which is great news. Nonetheless at this point the Levaquin as well as the doxycycline was stopped she had been on this for quite a while. It was also at this point that she was referred to me for further evaluation and treatment due to this wound. Patient does have a history of hypertension but really no other major medical problems she typically is not an individual to take a lot of medications and she appears to be in very good health. 03-30-2023 upon evaluation today patient appears to be doing well currently in regard to her wounds. In fact the wound seems to be doing significantly better which is great news. Fortunately I do not see any signs of active infection locally or systemically which is great news and in general I do believe that she is making really good progress towards complete closure. In fact in just 1 week's time this has dramatically decreased in size and again is much healthier appearing. 7/15; patient presents for follow-up. She has been using collagen with Xeroform to the wound bed. She has no pain. She has noted no drainage. Her wound appears closed but appears freshly healed. She is able to wear regular tennis shoes. Electronic Signature(s) Signed: 04/05/2023 4:51:26 PM By: Geralyn Corwin DO Entered By: Geralyn Corwin on 04/04/2023 16:23:34 Linda Braun ([id number]) [id number] Page 2 of 7 -------------------------------------------------------------------------------- Physical Exam Details Patient Name: Date of Service: Linda Braun 04/04/2023 3:45 PM Medical Record Number: [id number] Patient Account Number: [id number] Date  of Birth/Sex: Treating  RN: 1951/05/25 (72 y.o. F) Primary Care Provider: Georgann Housekeeper Other Clinician: Referring Provider: Treating Provider/Extender: Orvan Seen Weeks in Treatment: 1 Constitutional respirations regular, non-labored and within target range for patient.. Cardiovascular 2+ dorsalis pedis/posterior tibialis pulses. Psychiatric pleasant and cooperative. Notes T the lateral aspect of the right foot there is a small bunion with a freshly epithelialized wound. No increased warmth, erythema or drainage noted. o Electronic Signature(s) Signed: 04/05/2023 4:51:26 PM By: Geralyn Corwin DO Entered By: Geralyn Corwin on 04/04/2023 16:24:19 -------------------------------------------------------------------------------- Physician Orders Details Patient Name: Date of Service: Linda Skeans, SUSA [name]. 04/04/2023 3:45 PM Medical Record Number: [id number] Patient Account Number: [id number] Date of Birth/Sex: Treating RN: Jun 23, 1951 (72 y.o. Fredderick Phenix Primary Care Provider: Georgann Housekeeper Other Clinician: Referring Provider: Treating Provider/Extender: Adline Mango in Treatment: 1 Verbal / Phone Orders: No Diagnosis Coding Follow-up Appointments ppointment in 1 week. - Dr. Baltazar Najjar July 24 th at 2pm Room 9 Return A Anesthetic (In clinic) Topical Lidocaine 4% applied to wound bed Bathing/ Shower/ Hygiene May shower with protection but do not get wound dressing(s) wet. Protect dressing(s) with water repellant cover (for example, large plastic bag) or a cast cover and may then take shower. - Cast Protector can be purchased from [employer], [employer]. Cost $17-$40 May shower and wash wound with soap and water. - Wash/shower and at the end of bathing, wash foot with Dial soap (Gold). On days when not changing wound dressing, during the shower, keep foot dry. Off-Loading Other: - Elevate feet when sitting (at heart  level or above, as tolerated) Wound Treatment Wound #1 - Foot Wound Laterality: Right, Lateral Cleanser: Soap and Water 3 x Per Week/30 Days Discharge Instructions: May shower and wash wound with dial antibacterial soap and water prior to dressing change. Cleanser: Vashe 5.8 (oz) 3 x Per Week/30 Days Discharge Instructions: Cleanse the wound with Vashe prior to applying a clean dressing using gauze sponges, not tissue or cotton balls. Peri-Wound Care: Skin Prep 3 x Per Week/30 Days KILA, LANGNESS ([id number]) [id number] Page 3 of 7 Discharge Instructions: Use skin prep around wound before applying border gauze Peri-Wound Care: Adhesive Remover 3 x Per Week/30 Days Discharge Instructions: apply to around wound to remove adhesive Prim Dressing: Netting #5 3 x Per Week/30 Days ary Discharge Instructions: or Use Over Border Gauze instead of Soft tape Secondary Dressing: ALLEVYN Gentle Border, 3x3 (in/in) 3 x Per Week/30 Days Discharge Instructions: Apply over Prisma+Xeroform Secured With: 68M Medipore Soft Cloth Surgical T 2x10 (in/yd) 3 x Per Week/30 Days ape Discharge Instructions: Secure with tape as desired. Patient Medications llergies: Macrobid, penicillin A Notifications Medication Indication Start End 04/04/2023 lidocaine DOSE topical 4 % cream - cream topical Electronic Signature(s) Signed: 04/05/2023 4:51:26 PM By: Geralyn Corwin DO Entered By: Geralyn Corwin on 04/04/2023 16:24:26 -------------------------------------------------------------------------------- Problem List Details Patient Name: Date of Service: Linda Skeans, SUSA [name]. 04/04/2023 3:45 PM Medical Record Number: [id number] Patient Account Number: [id number] Date of Birth/Sex: Treating RN: 1951-04-18 (72 y.o. F) Primary Care Provider: Georgann Housekeeper Other Clinician: Referring Provider: Treating Provider/Extender: Adline Mango in Treatment: 1 Active  Problems ICD-10 Encounter Code Description Active Date MDM Diagnosis L02.611 Cutaneous abscess of right foot 03/23/2023 No Yes L97.512 Non-pressure chronic ulcer of other part of right foot with fat layer exposed 03/23/2023 No Yes I10 Essential (primary) hypertension 03/23/2023 No Yes Inactive Problems Resolved Problems Electronic Signature(s) Signed: 04/05/2023 4:51:26 PM By: Geralyn Corwin DO Entered  By: Geralyn Corwin on 04/04/2023 16:22:44 Linda Braun ([id number]) [id number] Page 4 of 7 -------------------------------------------------------------------------------- Progress Note Details Patient Name: Date of Service: Linda Braun 04/04/2023 3:45 PM Medical Record Number: [id number] Patient Account Number: [id number] Date of Birth/Sex: Treating RN: 1950-10-27 (72 y.o. F) Primary Care Provider: Georgann Housekeeper Other Clinician: Referring Provider: Treating Provider/Extender: Adline Mango in Treatment: 1 Subjective Chief Complaint Information obtained from Patient Right lateral foot ulcer/abscess History of Present Illness (HPI) 03-23-2023 upon evaluation today patient appears to be doing somewhat poorly in regard to her wound on the lateral aspect of her foot. She tells me that somewhere around March around the middle she had something that shattered in her house this was glass and she went into cleaning up she was barefoot. Subsequently she ended up getting a shard of glass in her right lateral foot at first she did not notice this and then has things went over the next week or so she began to have an issue with this region. She originally somewhere around the middle of April went to dermatology where they attempted to debride and remove anything that was fairly good x-ray that showed nothing that was remaining in the foot they felt they gotten all. Subsequently she ended up having this worsen yet again and it was around  this time that eventually they ordered an MRI as well as make an ID referral due to concerns about the x-ray showing osteomyelitis or at least a possibility of this. It ended up that the MRI showed no evidence of osteomyelitis which is great news. Nonetheless at this point the Levaquin as well as the doxycycline was stopped she had been on this for quite a while. It was also at this point that she was referred to me for further evaluation and treatment due to this wound. Patient does have a history of hypertension but really no other major medical problems she typically is not an individual to take a lot of medications and she appears to be in very good health. 03-30-2023 upon evaluation today patient appears to be doing well currently in regard to her wounds. In fact the wound seems to be doing significantly better which is great news. Fortunately I do not see any signs of active infection locally or systemically which is great news and in general I do believe that she is making really good progress towards complete closure. In fact in just 1 week's time this has dramatically decreased in size and again is much healthier appearing. 7/15; patient presents for follow-up. She has been using collagen with Xeroform to the wound bed. She has no pain. She has noted no drainage. Her wound appears closed but appears freshly healed. She is able to wear regular tennis shoes. Patient History Information obtained from Patient. Family History Unknown History. Social History Former smoker - quit 1973, Alcohol Use - Never, Drug Use - No History, Caffeine Use - Never. Medical History Cardiovascular Patient has history of Hypertension Hospitalization/Surgery History - 2015 right knee arthroscopy. - Left acoustic neuroma excision. - bone flap reconstructed skull (titanium plate). - breast reduction surgery. - c-section. - tonsillectomy. Medical A Surgical History Notes nd Ear/Nose/Mouth/Throat Left ear  neuroma Musculoskeletal meniscus tear Psychiatric anxiety Objective Constitutional respirations regular, non-labored and within target range for patient.. Vitals Time Taken: 4:06 PM, Height: 63 in, Weight: 154 lbs, BMI: 27.3, Temperature: 98.7 F, Pulse: 74 bpm, Respiratory Rate: 18 breaths/min, Blood Pressure: 116/69 mmHg. LASHENA, INCLAN ([id number]) [id number]  Page 5 of 7 Cardiovascular 2+ dorsalis pedis/posterior tibialis pulses. Psychiatric pleasant and cooperative. General Notes: T the lateral aspect of the right foot there is a small bunion with a freshly epithelialized wound. No increased warmth, erythema or drainage o noted. Integumentary (Hair, Skin) Wound #1 status is Open. Original cause of wound was Trauma. The date acquired was: 12/13/2022. The wound has been in treatment 1 weeks. The wound is located on the Right,Lateral Foot. The wound measures 0.1cm length x 0.1cm width x 0.1cm depth; 0.008cm^2 area and 0.001cm^3 volume. There is no tunneling or undermining noted. There is a none present amount of drainage noted. The wound margin is flat and intact. There is no granulation within the wound bed. There is no necrotic tissue within the wound bed. The periwound skin appearance had no abnormalities noted for moisture. The periwound skin appearance had no abnormalities noted for color. The periwound skin appearance exhibited: Callus. The periwound skin appearance did not exhibit: Crepitus, Excoriation, Induration, Rash, Scarring. Periwound temperature was noted as No Abnormality. Assessment Active Problems ICD-10 Cutaneous abscess of right foot Non-pressure chronic ulcer of other part of right foot with fat layer exposed Essential (primary) hypertension Patient has done well with collagen and Xeroform. The wound appears freshly healed however I did recommend she wear a Band-Aid and change this daily to observe if there is any drainage. If there is  any drainage she knows to restart the collagen and Xeroform. Follow-up in 1 week to assure that this has successfully closed. She knows to call with any questions or concerns. Plan Follow-up Appointments: Return Appointment in 1 week. - Dr. Baltazar Najjar July 24 th at 2pm Room 9 Anesthetic: (In clinic) Topical Lidocaine 4% applied to wound bed Bathing/ Shower/ Hygiene: May shower with protection but do not get wound dressing(s) wet. Protect dressing(s) with water repellant cover (for example, large plastic bag) or a cast cover and may then take shower. - Cast Protector can be purchased from [employer], [employer]. Cost $17-$40 May shower and wash wound with soap and water. - Wash/shower and at the end of bathing, wash foot with Dial soap (Gold). On days when not changing wound dressing, during the shower, keep foot dry. Off-Loading: Other: - Elevate feet when sitting (at heart level or above, as tolerated) The following medication(s) was prescribed: lidocaine topical 4 % cream cream topical was prescribed at facility WOUND #1: - Foot Wound Laterality: Right, Lateral Cleanser: Soap and Water 3 x Per Week/30 Days Discharge Instructions: May shower and wash wound with dial antibacterial soap and water prior to dressing change. Cleanser: Vashe 5.8 (oz) 3 x Per Week/30 Days Discharge Instructions: Cleanse the wound with Vashe prior to applying a clean dressing using gauze sponges, not tissue or cotton balls. Peri-Wound Care: Skin Prep 3 x Per Week/30 Days Discharge Instructions: Use skin prep around wound before applying border gauze Peri-Wound Care: Adhesive Remover 3 x Per Week/30 Days Discharge Instructions: apply to around wound to remove adhesive Prim Dressing: Netting #5 3 x Per Week/30 Days ary Discharge Instructions: or Use Over Border Gauze instead of Soft tape Secondary Dressing: ALLEVYN Gentle Border, 3x3 (in/in) 3 x Per Week/30 Days Discharge Instructions: Apply over  Prisma+Xeroform Secured With: 52M Medipore Soft Cloth Surgical T 2x10 (in/yd) 3 x Per Week/30 Days ape Discharge Instructions: Secure with tape as desired. 1. Bandage daily 2. Follow-up in one week Electronic Signature(s) Signed: 04/05/2023 4:51:26 PM By: Geralyn Corwin DO Entered By: Geralyn Corwin on 04/04/2023 16:26:29 Holdren,  Lajuan Braun ([id number]) [id number] Page 6 of 7 -------------------------------------------------------------------------------- HxROS Details Patient Name: Date of Service: Linda Braun 04/04/2023 3:45 PM Medical Record Number: [id number] Patient Account Number: [id number] Date of Birth/Sex: Treating RN: 1951-04-13 (72 y.o. F) Primary Care Provider: Georgann Housekeeper Other Clinician: Referring Provider: Treating Provider/Extender: Adline Mango in Treatment: 1 Information Obtained From Patient Ear/Nose/Mouth/Throat Medical History: Past Medical History Notes: Left ear neuroma Cardiovascular Medical History: Positive for: Hypertension Musculoskeletal Medical History: Past Medical History Notes: meniscus tear Psychiatric Medical History: Past Medical History Notes: anxiety Immunizations Pneumococcal Vaccine: Received Pneumococcal Vaccination: No Implantable Devices Yes Hospitalization / Surgery History Type of Hospitalization/Surgery 2015 right knee arthroscopy 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) breast reduction surgery c-section tonsillectomy Family and Social History Unknown History: Yes; Former smoker - quit 1973; Alcohol Use: Never; Drug Use: No History; Caffeine Use: Never; Financial Concerns: No; Food, Clothing or Shelter Needs: No; Support System Lacking: No; Transportation Concerns: No Electronic Signature(s) Signed: 04/05/2023 4:51:26 PM By: Geralyn Corwin DO Entered By: Geralyn Corwin on 04/04/2023 16:23:40 SuperBill  Details -------------------------------------------------------------------------------- Linda Braun ([id number]) [id number] Page 7 of 7 Patient Name: Date of Service: Linda Braun 04/04/2023 Medical Record Number: [id number] Patient Account Number: [id number] Date of Birth/Sex: Treating RN: [birth year] (72 y.o. F) Primary Care Provider: Georgann Housekeeper Other Clinician: Referring Provider: Treating Provider/Extender: Orvan Seen Weeks in Treatment: 1 Diagnosis Coding ICD-10 Codes Code Description [phone] Cutaneous abscess of right foot L97.512 Non-pressure chronic ulcer of other part of right foot with fat layer exposed I10 Essential (primary) hypertension Facility Procedures CPT4 Code Description Modifier Quantity [id number] - WOUND CARE VISIT-LEV 2 EST PT 1 Physician Procedures Quantity CPT4 Code Description Modifier [id number] - WC PHYS LEVEL 3 - EST PT 1 ICD-10 Diagnosis Description L02.611 Cutaneous abscess of right foot L97.512 Non-pressure chronic ulcer of other part of right foot with fat layer exposed I10 Essential (primary) hypertension Electronic Signature(s) Signed: 04/14/2023 11:08:42 AM By: Pearletha Alfred Signed: 04/14/2023 3:41:47 PM By: Geralyn Corwin DO Previous Signature: 04/05/2023 4:51:26 PM Version By: Geralyn Corwin DO Entered By: Pearletha Alfred on 04/14/2023 11:08:42

## 2023-04-13 ENCOUNTER — Encounter (HOSPITAL_BASED_OUTPATIENT_CLINIC_OR_DEPARTMENT_OTHER): Payer: Medicare Other | Admitting: Internal Medicine

## 2023-04-13 DIAGNOSIS — L97512 Non-pressure chronic ulcer of other part of right foot with fat layer exposed: Secondary | ICD-10-CM | POA: Diagnosis not present

## 2023-04-14 NOTE — Progress Notes (Addendum)
Linda Braun ([id number]) [id number] Page 1 of 5 Visit Report for 04/13/2023 HPI Details Patient Name: Date of Service: Linda Braun 04/13/2023 2:00 PM Medical Record Number: [id number] Patient Account Number: [id number] Date of Birth/Sex: Treating RN: 02-Dec-1950 (72 y.o. F) Primary Care Provider: Georgann Housekeeper Other Clinician: Referring Provider: Treating Provider/Extender: Georgana Curio, Derald Macleod in Treatment: 3 History of Present Illness HPI Description: 03-23-2023 upon evaluation today patient appears to be doing somewhat poorly in regard to her wound on the lateral aspect of her foot. She tells me that somewhere around March around the middle she had something that shattered in her house this was glass and she went into cleaning up she was barefoot. Subsequently she ended up getting a shard of glass in her right lateral foot at first she did not notice this and then has things went over the next week or so she began to have an issue with this region. She originally somewhere around the middle of April went to dermatology where they attempted to debride and remove anything that was fairly good x-ray that showed nothing that was remaining in the foot they felt they gotten all. Subsequently she ended up having this worsen yet again and it was around this time that eventually they ordered an MRI as well as make an ID referral due to concerns about the x- ray showing osteomyelitis or at least a possibility of this. It ended up that the MRI showed no evidence of osteomyelitis which is great news. Nonetheless at this point the Levaquin as well as the doxycycline was stopped she had been on this for quite a while. It was also at this point that she was referred to me for further evaluation and treatment due to this wound. Patient does have a history of hypertension but really no other major medical problems she typically is not an individual to take  a lot of medications and she appears to be in very good health. 03-30-2023 upon evaluation today patient appears to be doing well currently in regard to her wounds. In fact the wound seems to be doing significantly better which is great news. Fortunately I do not see any signs of active infection locally or systemically which is great news and in general I do believe that she is making really good progress towards complete closure. In fact in just 1 week's time this has dramatically decreased in size and again is much healthier appearing. 7/15; patient presents for follow-up. She has been using collagen with Xeroform to the wound bed. She has no pain. She has noted no drainage. Her wound appears closed but appears freshly healed. She is able to wear regular tennis shoes. 7/24; everything is healed on her feet. She has a raised callus area on the lateral part of her fifth metatarsal base on the right foot. She has no open wound in this area but some callus. This is where she had the glass that required a minor surgical procedure according to the patient. She has no open wounds on her feet. Electronic Signature(s) Signed: 04/13/2023 4:12:50 PM By: Baltazar Najjar MD Entered By: Baltazar Najjar on 04/13/2023 14:39:59 -------------------------------------------------------------------------------- Physical Exam Details Patient Name: Date of Service: Linda Braun, Linda Braun. 04/13/2023 2:00 PM Medical Record Number: [id number] Patient Account Number: [id number] Date of Birth/Sex: Treating RN: Jun 19, 1951 (72 y.o. F) Primary Care Provider: Georgann Housekeeper Other Clinician: Referring Provider: Treating Provider/Extender: Sherryll Burger in Treatment: 3 Constitutional Patient is hypertensive.. Pulse  regular and within target range for patient.Marland Kitchen Respirations regular, non-labored and within target range.. Temperature is normal and within the target range for the patient.Marland Kitchen Appears in no  distress. Notes Wound exam; the patient has no open wound. She has hallucis valgus deformities bilaterally however there is no open wound or callus over the metatarsal heads medially. The most problematic area is at the base of the fifth metatarsal here she has to raise swelling some callus but no open wound. There is no tenderness no erythema no drainage Electronic Signature(s) Linda Braun, Linda Braun ([id number]) [id number] Page 2 of 5 Signed: 04/13/2023 4:12:50 PM By: Baltazar Najjar MD Entered By: Baltazar Najjar on 04/13/2023 14:35:57 -------------------------------------------------------------------------------- Physician Orders Details Patient Name: Date of Service: Linda Braun, Linda Braun. 04/13/2023 2:00 PM Medical Record Number: [id number] Patient Account Number: [id number] Date of Birth/Sex: Treating RN: 07/22/1951 (72 y.o. Arta Silence Primary Care Provider: Georgann Housekeeper Other Clinician: Referring Provider: Treating Provider/Extender: Sherryll Burger in Treatment: 3 Verbal / Phone Orders: No Diagnosis Coding ICD-10 Coding Code Description L02.611 Cutaneous abscess of right foot L97.512 Non-pressure chronic ulcer of other part of right foot with fat layer exposed I10 Essential (primary) hypertension Discharge From [facility] Services Discharge from Wound Care Center - You can use callous pads to protect this right foot closed area especially when walking. lotion feet and legs every night before bed. Ensure you wear wide but fitting shoes while walking and standing. Wound Treatment Electronic Signature(s) Signed: 04/13/2023 4:12:50 PM By: Baltazar Najjar MD Signed: 04/13/2023 6:08:49 PM By: Shawn Stall RN, BSN Entered By: Shawn Stall on 04/13/2023 14:21:48 -------------------------------------------------------------------------------- Problem List Details Patient Name: Date of Service: Linda Braun, Linda Braun. 04/13/2023 2:00 PM Medical  Record Number: [id number] Patient Account Number: [id number] Date of Birth/Sex: Treating RN: Jun 24, 1951 (72 y.o. Arta Silence Primary Care Provider: Georgann Housekeeper Other Clinician: Referring Provider: Treating Provider/Extender: Sherryll Burger in Treatment: 3 Active Problems ICD-10 Encounter Code Description Active Date MDM Diagnosis L02.611 Cutaneous abscess of right foot 03/23/2023 No Yes L97.512 Non-pressure chronic ulcer of other part of right foot with fat layer exposed 03/23/2023 No Yes I10 Essential (primary) hypertension 03/23/2023 No Yes Linda Braun, Linda Braun ([id number]) [id number] Page 3 of 5 Inactive Problems Resolved Problems Electronic Signature(s) Signed: 04/13/2023 4:12:50 PM By: Baltazar Najjar MD Entered By: Baltazar Najjar on 04/13/2023 14:32:46 -------------------------------------------------------------------------------- Progress Note Details Patient Name: Date of Service: Linda Braun, Linda Braun. 04/13/2023 2:00 PM Medical Record Number: [id number] Patient Account Number: [id number] Date of Birth/Sex: Treating RN: 1951/09/15 (72 y.o. F) Primary Care Provider: Georgann Housekeeper Other Clinician: Referring Provider: Treating Provider/Extender: Georgana Curio, Derald Macleod in Treatment: 3 Subjective History of Present Illness (HPI) 03-23-2023 upon evaluation today patient appears to be doing somewhat poorly in regard to her wound on the lateral aspect of her foot. She tells me that somewhere around March around the middle she had something that shattered in her house this was glass and she went into cleaning up she was barefoot. Subsequently she ended up getting a shard of glass in her right lateral foot at first she did not notice this and then has things went over the next week or so she began to have an issue with this region. She originally somewhere around the middle of April went to dermatology where they attempted to  debride and remove anything that was fairly good x-ray that showed nothing that was remaining in the foot they felt they gotten all. Subsequently  she ended up having this worsen yet again and it was around this time that eventually they ordered an MRI as well as make an ID referral due to concerns about the x-ray showing osteomyelitis or at least a possibility of this. It ended up that the MRI showed no evidence of osteomyelitis which is great news. Nonetheless at this point the Levaquin as well as the doxycycline was stopped she had been on this for quite a while. It was also at this point that she was referred to me for further evaluation and treatment due to this wound. Patient does have a history of hypertension but really no other major medical problems she typically is not an individual to take a lot of medications and she appears to be in very good health. 03-30-2023 upon evaluation today patient appears to be doing well currently in regard to her wounds. In fact the wound seems to be doing significantly better which is great news. Fortunately I do not see any signs of active infection locally or systemically which is great news and in general I do believe that she is making really good progress towards complete closure. In fact in just 1 week's time this has dramatically decreased in size and again is much healthier appearing. 7/15; patient presents for follow-up. She has been using collagen with Xeroform to the wound bed. She has no pain. She has noted no drainage. Her wound appears closed but appears freshly healed. She is able to wear regular tennis shoes. 7/24; everything is healed on her feet. She has a raised callus area on the lateral part of her fifth metatarsal base on the right foot. She has no open wound in this area but some callus. This is where she had the glass that required a minor surgical procedure according to the patient. She has no open wounds on  her feet. Objective Constitutional Patient is hypertensive.. Pulse regular and within target range for patient.Marland Kitchen Respirations regular, non-labored and within target range.. Temperature is normal and within the target range for the patient.Marland Kitchen Appears in no distress. Vitals Time Taken: 2:08 PM, Height: 63 in, Weight: 154 lbs, BMI: 27.3, Pulse: 74 bpm, Respiratory Rate: 20 breaths/min, Blood Pressure: 151/81 mmHg. General Notes: Wound exam; the patient has no open wound. She has hallucis valgus deformities bilaterally however there is no open wound or callus over the metatarsal heads medially. The most problematic area is at the base of the fifth metatarsal here she has to raise swelling some callus but no open wound. There is no tenderness no erythema no drainage Integumentary (Hair, Skin) Wound #1 status is Healed - Epithelialized. Original cause of wound was Trauma. The date acquired was: 12/13/2022. The wound has been in treatment 3 weeks. The wound is located on the Right,Lateral Foot. The wound measures 0cm length x 0cm width x 0cm depth; 0cm^2 area and 0cm^3 volume. There is a none present amount of drainage noted. The wound margin is flat and intact. There is no granulation within the wound bed. There is no necrotic tissue within the Linda Braun, Linda Braun ([id number]) [id number] Page 4 of 5 wound bed. The periwound skin appearance had no abnormalities noted for moisture. The periwound skin appearance had no abnormalities noted for color. The periwound skin appearance exhibited: Callus. The periwound skin appearance did not exhibit: Crepitus, Excoriation, Induration, Rash, Scarring. Periwound temperature was noted as No Abnormality. Assessment Active Problems ICD-10 Cutaneous abscess of right foot Non-pressure chronic ulcer of other part of right foot with  fat layer exposed Essential (primary) hypertension Plan Discharge From [facility] Main Services: Discharge from Wound Care  Center - You can use callous pads to protect this right foot closed area especially when walking. lotion feet and legs every night before bed. Ensure you wear wide but fitting shoes while walking and standing. 1. The patient has no open wound. The area on her right lateral foot at the head of her fifth metatarsal is healed however there is a raised firm area here with some callus. No tenderness however. 2. I spent some time talking to the patient about this. She may be inverted at the ankle in this area when she is walking. I think there is enough of a deformity here to keep this padded with thick Band-Aids and/or callus pads. She is already using wide forefoot running shoes which seems like a good idea. 3. She also has hallucis valgus deformities on both sides at the base of the first metatarsal but there does not appear to be an area here that is problematic 4 she can be discharged from the clinic Electronic Signature(s) Signed: 04/14/2023 3:14:15 PM By: Baltazar Najjar MD Signed: 04/15/2023 4:29:32 PM By: Shawn Stall RN, BSN Previous Signature: 04/13/2023 4:12:50 PM Version By: Baltazar Najjar MD Entered By: Shawn Stall on 04/14/2023 08:31:11 -------------------------------------------------------------------------------- SuperBill Details Patient Name: Date of Service: Linda Braun, Linda Braun 04/13/2023 Medical Record Number: [id number] Patient Account Number: [id number] Date of Birth/Sex: Treating RN: 1951/07/12 (72 y.o. Arta Silence Primary Care Provider: Georgann Housekeeper Other Clinician: Referring Provider: Treating Provider/Extender: Sherryll Burger in Treatment: 3 Diagnosis Coding ICD-10 Codes Code Description [phone] Cutaneous abscess of right foot L97.512 Non-pressure chronic ulcer of other part of right foot with fat layer exposed I10 Essential (primary) hypertension Facility Procedures : CPT4 Code: [id number] Description: 99213 - WOUND CARE VISIT-LEV 3  EST PT Modifier: Quantity: 1 Physician Procedures : CPT4 Code Description Modifier [id number] 99213 - WC PHYS LEVEL 3 - EST PT Linda Braun, Linda Braun ([id number]) [id number] ICD-10 Diagnosis Description L97.512 Non-pressure chronic ulcer of other part of right foot with fat layer  exposed L02.611 Cutaneous abscess of right foot Quantity: 1 Page 5 of 5 Electronic Signature(s) Signed: 04/13/2023 4:12:50 PM By: Baltazar Najjar MD Entered By: Baltazar Najjar on 04/13/2023 14:40:21

## 2023-04-14 NOTE — Progress Notes (Signed)
SHARRONDA, SCHWEERS ([id number]) [id number] Page 1 of 8 Visit Report for 04/13/2023 Arrival Information Details Patient Name: Date of Service: Kayren Eaves 04/13/2023 2:00 PM Medical Record Number: [id number] Patient Account Number: [id number] Date of Birth/Sex: Treating RN: 1951-05-04 (72 y.o. Debara Pickett, Millard.Loa Primary Care Infant Zink: Georgann Housekeeper Other Clinician: Referring Hayde Kilgour: Treating Loan Oguin/Extender: Sherryll Burger in Treatment: 3 Visit Information History Since Last Visit Added or deleted any medications: No Patient Arrived: Ambulatory Any new allergies or adverse reactions: No Arrival Time: 14:08 Had a fall or experienced change in No Accompanied By: self activities of daily living that may affect Transfer Assistance: None risk of falls: Patient Identification Verified: Yes Signs or symptoms of abuse/neglect since last visito No Secondary Verification Process Completed: Yes Hospitalized since last visit: No Patient Requires Transmission-Based Precautions: No Implantable device outside of the clinic excluding No Patient Has Alerts: No cellular tissue based products placed in the center since last visit: Has Dressing in Place as Prescribed: Yes Pain Present Now: No Electronic Signature(s) Signed: 04/13/2023 6:08:49 PM By: Shawn Stall RN, BSN Entered By: Shawn Stall on 04/13/2023 14:08:18 -------------------------------------------------------------------------------- Clinic Level of Care Assessment Details Patient Name: Date of Service: STEV ENS, [name] 04/13/2023 2:00 PM Medical Record Number: [id number] Patient Account Number: [id number] Date of Birth/Sex: Treating RN: January 07, 1951 (72 y.o. Arta Silence Primary Care Daune Divirgilio: Georgann Housekeeper Other Clinician: Referring Dione Petron: Treating Damany Eastman/Extender: Sherryll Burger in Treatment: 3 Clinic Level of Care Assessment Items TOOL 4  Quantity Score X- 1 0 Use when only an EandM is performed on FOLLOW-UP visit ASSESSMENTS - Nursing Assessment / Reassessment X- 1 10 Reassessment of Co-morbidities (includes updates in patient status) X- 1 5 Reassessment of Adherence to Treatment Plan ASSESSMENTS - Wound and Skin A ssessment / Reassessment X - Simple Wound Assessment / Reassessment - one wound 1 5 []  - 0 Complex Wound Assessment / Reassessment - multiple wounds X- 1 10 Dermatologic / Skin Assessment (not related to wound area) ASSESSMENTS - Focused Assessment []  - 0 Circumferential Edema Measurements - multi extremities []  - 0 Nutritional Assessment / Counseling / Intervention SHARLEEN, SZCZESNY ([id number]) [id number] Page 2 of 8 []  - 0 Lower Extremity Assessment (monofilament, tuning fork, pulses) []  - 0 Peripheral Arterial Disease Assessment (using hand held doppler) ASSESSMENTS - Ostomy and/or Continence Assessment and Care []  - 0 Incontinence Assessment and Management []  - 0 Ostomy Care Assessment and Management (repouching, etc.) PROCESS - Coordination of Care X - Simple Patient / Family Education for ongoing care 1 15 []  - 0 Complex (extensive) Patient / Family Education for ongoing care X- 1 10 Staff obtains Chiropractor, Records, T Results / Process Orders est []  - 0 Staff telephones HHA, Nursing Homes / Clarify orders / etc []  - 0 Routine Transfer to another Facility (non-emergent condition) []  - 0 Routine Hospital Admission (non-emergent condition) []  - 0 New Admissions / Manufacturing engineer / Ordering NPWT Apligraf, etc. , []  - 0 Emergency Hospital Admission (emergent condition) X- 1 10 Simple Discharge Coordination []  - 0 Complex (extensive) Discharge Coordination PROCESS - Special Needs []  - 0 Pediatric / Minor Patient Management []  - 0 Isolation Patient Management []  - 0 Hearing / Language / Visual special needs []  - 0 Assessment of Community  assistance (transportation, D/C planning, etc.) []  - 0 Additional assistance / Altered mentation []  - 0 Support Surface(s) Assessment (bed, cushion, seat, etc.) INTERVENTIONS - Wound Cleansing / Measurement X - Simple  Wound Cleansing - one wound 1 5 []  - 0 Complex Wound Cleansing - multiple wounds X- 1 5 Wound Imaging (photographs - any number of wounds) []  - 0 Wound Tracing (instead of photographs) X- 1 5 Simple Wound Measurement - one wound []  - 0 Complex Wound Measurement - multiple wounds INTERVENTIONS - Wound Dressings []  - 0 Small Wound Dressing one or multiple wounds []  - 0 Medium Wound Dressing one or multiple wounds []  - 0 Large Wound Dressing one or multiple wounds []  - 0 Application of Medications - topical []  - 0 Application of Medications - injection INTERVENTIONS - Miscellaneous []  - 0 External ear exam []  - 0 Specimen Collection (cultures, biopsies, blood, body fluids, etc.) []  - 0 Specimen(s) / Culture(s) sent or taken to Lab for analysis []  - 0 Patient Transfer (multiple staff / Nurse, adult / Similar devices) []  - 0 Simple Staple / Suture removal (25 or less) []  - 0 Complex Staple / Suture removal (26 or more) []  - 0 Hypo / Hyperglycemic Management (close monitor of Blood Glucose) Witte, Aymara L ([id number]) [id number] Page 3 of 8 []  - 0 Ankle / Brachial Index (ABI) - do not check if billed separately X- 1 5 Vital Signs Has the patient been seen at the hospital within the last three years: Yes Total Score: 85 Level Of Care: New/Established - Level 3 Electronic Signature(s) Signed: 04/13/2023 6:08:49 PM By: Shawn Stall RN, BSN Entered By: Shawn Stall on 04/13/2023 14:22:32 -------------------------------------------------------------------------------- Encounter Discharge Information Details Patient Name: Date of Service: [name], [name] 04/13/2023 2:00 PM Medical Record Number: [id number] Patient Account  Number: [id number] Date of Birth/Sex: Treating RN: 1951/01/14 (72 y.o. Arta Silence Primary Care Merrill Deanda: Georgann Housekeeper Other Clinician: Referring Vinette Crites: Treating Derian Dimalanta/Extender: Sherryll Burger in Treatment: 3 Encounter Discharge Information Items Discharge Condition: Stable Ambulatory Status: Ambulatory Discharge Destination: Home Transportation: Private Auto Accompanied By: self Schedule Follow-up Appointment: Yes Clinical Summary of Care: Electronic Signature(s) Signed: 04/13/2023 6:08:49 PM By: Shawn Stall RN, BSN Entered By: Shawn Stall on 04/13/2023 14:23:07 -------------------------------------------------------------------------------- Lower Extremity Assessment Details Patient Name: Date of Service: [name], [name] 04/13/2023 2:00 PM Medical Record Number: [id number] Patient Account Number: [id number] Date of Birth/Sex: Treating RN: [birth year] (72 y.o. Arta Silence Primary Care Emiliano Welshans: Georgann Housekeeper Other Clinician: Referring Wetona Viramontes: Treating Kirubel Aja/Extender: Sherryll Burger in Treatment: 3 Edema Assessment Assessed: Kyra Searles: No] Franne Forts: No] [Left: Edema] [Right: :] Calf Left: Right: Point of Measurement: From Medial Instep 36 cm Ankle Left: Right: Point of Measurement: From Medial Instep 20 cm Vascular Assessment Ontko, Aslee L ([id number]) [Right:[id number] Page 4 of 8] Pulses: Dorsalis Pedis Palpable: [Right:Yes] Extremity colors, hair growth, and conditions: Extremity Color: [Right:Normal] Hair Growth on Extremity: [Right:No] Temperature of Extremity: [Right:Warm] Capillary Refill: [Right:< 3 seconds] Dependent Rubor: [Right:No] Blanched when Elevated: [Right:No No] Toe Nail Assessment Left: Right: Thick: No Discolored: No Deformed: No Improper Length and Hygiene: No Electronic Signature(s) Signed: 04/13/2023 6:08:49 PM By: Shawn Stall RN, BSN Entered  By: Shawn Stall on 04/13/2023 14:08:54 -------------------------------------------------------------------------------- Multi Wound Chart Details Patient Name: Date of Service: [name], [name] 04/13/2023 2:00 PM Medical Record Number: [id number] Patient Account Number: [id number] Date of Birth/Sex: Treating RN: 02-27-1951 (72 y.o. F) Primary Care Chessa Barrasso: Georgann Housekeeper Other Clinician: Referring Chrishaun Sasso: Treating  Shellhammer/Extender: Sherryll Burger in Treatment: 3 Vital Signs Height(in): 63 Pulse(bpm): 74 Weight(lbs): 154 Blood Pressure(mmHg): 151/81 Body Mass Index(BMI): 27.3 Temperature(F): Respiratory Rate(breaths/min): 20 [  1:Photos:] [N/A:N/A] Right, Lateral Foot N/A N/A Wound Location: Trauma N/A N/A Wounding Event: Abscess N/A N/A Primary Etiology: Hypertension N/A N/A Comorbid History: 12/13/2022 N/A N/A Date Acquired: 3 N/A N/A Weeks of Treatment: Healed - Epithelialized N/A N/A Wound Status: No N/A N/A Wound Recurrence: 0x0x0 N/A N/A Measurements L x W x D (cm) 0 N/A N/A A (cm) : rea 0 N/A N/A Volume (cm) : 100.00% N/A N/A % Reduction in A rea: 100.00% N/A N/A % Reduction in Volume: Full Thickness Without Exposed N/A N/A Classification: Support Structures None Present N/A N/A Exudate Amount: Flat and Intact N/A N/A Wound Margin: TAYTEN, HEBER L ([id number]) [id number] Page 5 of 8 None Present (0%) N/A N/A Granulation Amount: None Present (0%) N/A N/A Necrotic Amount: Fascia: No N/A N/A Exposed Structures: Fat Layer (Subcutaneous Tissue): No Tendon: No Muscle: No Joint: No Bone: No Large (67-100%) N/A N/A Epithelialization: Callus: Yes N/A N/A Periwound Skin Texture: Excoriation: No Induration: No Crepitus: No Rash: No Scarring: No Maceration: No N/A N/A Periwound Skin Moisture: Dry/Scaly: No Atrophie Blanche: No N/A N/A Periwound Skin Color: Cyanosis: No Ecchymosis:  No Erythema: No Hemosiderin Staining: No Mottled: No Pallor: No Rubor: No No Abnormality N/A N/A Temperature: Treatment Notes Wound #1 (Foot) Wound Laterality: Right, Lateral Cleanser Peri-Wound Care Topical Primary Dressing Secondary Dressing Secured With Compression Wrap Compression Stockings Add-Ons Notes foam border applied to right foot for protection. Electronic Signature(s) Signed: 04/13/2023 4:12:50 PM By: Baltazar Najjar MD Entered By: Baltazar Najjar on 04/13/2023 14:32:52 -------------------------------------------------------------------------------- Multi-Disciplinary Care Plan Details Patient Name: Date of Service: [name], [name] 04/13/2023 2:00 PM Medical Record Number: [id number] Patient Account Number: [id number] Date of Birth/Sex: Treating RN: May 23, 1951 (72 y.o. Arta Silence Primary Care Shade Rivenbark: Georgann Housekeeper Other Clinician: Referring Ghina Bittinger: Treating Dnya Hickle/Extender: Sherryll Burger in Treatment: 3 Active Inactive Electronic Signature(s) Signed: 04/13/2023 6:08:49 PM By: Shawn Stall RN, BSN Missildine, Charm Barges ([id number]) [id number] Page 6 of 8 Entered By: Shawn Stall on 04/13/2023 14:21:54 -------------------------------------------------------------------------------- Pain Assessment Details Patient Name: Date of Service: Kayren Eaves 04/13/2023 2:00 PM Medical Record Number: [id number] Patient Account Number: [id number] Date of Birth/Sex: Treating RN: January 10, 1951 (72 y.o. Arta Silence Primary Care Elham Fini: Georgann Housekeeper Other Clinician: Referring Nevada Kirchner: Treating Cristina Mattern/Extender: Sherryll Burger in Treatment: 3 Active Problems Location of Pain Severity and Description of Pain Patient Has Paino No Site Locations Pain Management and Medication Current Pain Management: Electronic Signature(s) Signed: 04/13/2023 6:08:49 PM By: Shawn Stall RN,  BSN Entered By: Shawn Stall on 04/13/2023 14:08:36 -------------------------------------------------------------------------------- Patient/Caregiver Education Details Patient Name: Date of Service: Kayren Eaves 7/24/2024andnbsp2:00 PM Medical Record Number: [id number] Patient Account Number: [id number] Date of Birth/Gender: Treating RN: July 10, 1951 (72 y.o. Arta Silence Primary Care Physician: Georgann Housekeeper Other Clinician: Referring Physician: Treating Physician/Extender: Sherryll Burger in Treatment: 3 Education Assessment Education Provided To: Patient Education Topics Provided Wound/Skin ImpairmentELIANNAH, HINDE ([id number]) [id number] Page 7 of 8 Handouts: Caring for Your Ulcer Methods: Explain/Verbal Responses: Reinforcements needed Electronic Signature(s) Signed: 04/13/2023 6:08:49 PM By: Shawn Stall RN, BSN Entered By: Shawn Stall on 04/13/2023 14:14:56 -------------------------------------------------------------------------------- Wound Assessment Details Patient Name: Date of Service: [name], [name] 04/13/2023 2:00 PM Medical Record Number: [id number] Patient Account Number: [id number] Date of Birth/Sex: Treating RN: 1951/06/05 (72 y.o. Arta Silence Primary Care Makalah Asberry: Georgann Housekeeper Other Clinician: Referring Telma Pyeatt: Treating Mylen Mangan/Extender: Sherryll Burger in Treatment: 3 Wound Status  Wound Number: 1 Primary Etiology: Abscess Wound Location: Right, Lateral Foot Wound Status: Healed - Epithelialized Wounding Event: Trauma Comorbid History: Hypertension Date Acquired: 12/13/2022 Weeks Of Treatment: 3 Clustered Wound: No Photos Wound Measurements Length: (cm) Width: (cm) Depth: (cm) Area: (cm) Volume: (cm) 0 % Reduction in Area: 100% 0 % Reduction in Volume: 100% 0 Epithelialization: Large (67-100%) 0 0 Wound Description Classification: Full  Thickness Without Exposed Support Wound Margin: Flat and Intact Exudate Amount: None Present Structures Foul Odor After Cleansing: No Slough/Fibrino No Wound Bed Granulation Amount: None Present (0%) Exposed Structure Necrotic Amount: None Present (0%) Fascia Exposed: No Fat Layer (Subcutaneous Tissue) Exposed: No Tendon Exposed: No Muscle Exposed: No Joint Exposed: No Bone Exposed: No Periwound Skin Texture Texture Color No Abnormalities Noted: No No Abnormalities Noted: Yes Callus: Yes Temperature / Pain Knoch, Cyncere L ([id number]) [id number] Page 8 of 8 Crepitus: No Temperature: No Abnormality Excoriation: No Induration: No Rash: No Scarring: No Moisture No Abnormalities Noted: Yes Treatment Notes Wound #1 (Foot) Wound Laterality: Right, Lateral Cleanser Peri-Wound Care Topical Primary Dressing Secondary Dressing Secured With Compression Wrap Compression Stockings Add-Ons Notes foam border applied to right foot for protection. Electronic Signature(s) Signed: 04/13/2023 6:08:49 PM By: Shawn Stall RN, BSN Entered By: Shawn Stall on 04/13/2023 14:22:03 -------------------------------------------------------------------------------- Vitals Details Patient Name: Date of Service: [name], [name] 04/13/2023 2:00 PM Medical Record Number: [id number] Patient Account Number: [id number] Date of Birth/Sex: Treating RN: 12-16-1950 (72 y.o. Debara Pickett, Millard.Loa Primary Care Texanna Hilburn: Georgann Housekeeper Other Clinician: Referring Anthem Frazer: Treating Caitlyn Buchanan/Extender: Sherryll Burger in Treatment: 3 Vital Signs Time Taken: 14:08 Pulse (bpm): 74 Height (in): 63 Respiratory Rate (breaths/min): 20 Weight (lbs): 154 Blood Pressure (mmHg): 151/81 Body Mass Index (BMI): 27.3 Reference Range: 80 - 120 mg / dl Electronic Signature(s) Signed: 04/13/2023 6:08:49 PM By: Shawn Stall RN, BSN Entered By: Shawn Stall on 04/13/2023  14:08:31

## 2023-04-20 ENCOUNTER — Encounter (HOSPITAL_BASED_OUTPATIENT_CLINIC_OR_DEPARTMENT_OTHER): Payer: Medicare Other | Admitting: General Surgery

## 2023-04-27 ENCOUNTER — Encounter (HOSPITAL_BASED_OUTPATIENT_CLINIC_OR_DEPARTMENT_OTHER): Payer: Medicare Other | Attending: Physician Assistant | Admitting: Physician Assistant

## 2023-04-27 DIAGNOSIS — L84 Corns and callosities: Secondary | ICD-10-CM | POA: Insufficient documentation

## 2023-04-27 DIAGNOSIS — L02611 Cutaneous abscess of right foot: Secondary | ICD-10-CM | POA: Insufficient documentation

## 2023-04-27 DIAGNOSIS — I10 Essential (primary) hypertension: Secondary | ICD-10-CM | POA: Insufficient documentation

## 2023-04-27 DIAGNOSIS — L97512 Non-pressure chronic ulcer of other part of right foot with fat layer exposed: Secondary | ICD-10-CM | POA: Diagnosis not present

## 2023-04-29 ENCOUNTER — Telehealth: Payer: Self-pay

## 2023-04-29 NOTE — Telephone Encounter (Signed)
Received call from [facility], referral coordinator requesting MRI results for Dr. Roderic Scarce to review. Results faxed.  P: [phone] F: [phone] Juanita Laster, RMA

## 2023-06-07 ENCOUNTER — Other Ambulatory Visit (HOSPITAL_BASED_OUTPATIENT_CLINIC_OR_DEPARTMENT_OTHER): Payer: Self-pay | Admitting: Internal Medicine

## 2023-06-07 DIAGNOSIS — Z8249 Family history of ischemic heart disease and other diseases of the circulatory system: Secondary | ICD-10-CM

## 2023-07-04 ENCOUNTER — Ambulatory Visit (HOSPITAL_BASED_OUTPATIENT_CLINIC_OR_DEPARTMENT_OTHER)
Admission: RE | Admit: 2023-07-04 | Discharge: 2023-07-04 | Disposition: A | Discharge status: Home / Self Care | Payer: Medicare Other | Source: Ambulatory Visit | Attending: Internal Medicine | Admitting: Internal Medicine

## 2023-07-04 DIAGNOSIS — Z8249 Family history of ischemic heart disease and other diseases of the circulatory system: Secondary | ICD-10-CM | POA: Insufficient documentation

## 2023-10-17 NOTE — Therapy (Signed)
OUTPATIENT PHYSICAL THERAPY FEMALE PELVIC EVALUATION   Patient Name: Linda Braun MRN: [id number] DOB:03-Oct-1950, 73 y.o., female Today's Date: 10/18/2023  END OF SESSION:  PT End of Session - 10/18/23 1717     Visit Number 1    Authorization Type MEDICARE PART A AND B    Authorization Time Period 1/26-3/25/25    PT Start Time 1530    PT Stop Time 1615    PT Time Calculation (min) 45 min    Activity Tolerance Patient tolerated treatment well    Behavior During Therapy WFL for tasks assessed/performed             Past Medical History:  Diagnosis Date   Anxiety    Meniscus tear    rt knee   Past Surgical History:  Procedure Laterality Date   acustic neuroma's removed     BONE FLAP RECONSTRUCTION SKULL     titanium plate   BREAST REDUCTION SURGERY     CESAREAN SECTION     x2   KNEE ARTHROSCOPY Right 10/03/2013   Procedure: RIGHT ARTHROSCOPY KNEE WITH DEBRIDEMENT;  Surgeon: Loanne Drilling, MD;  Location: [facility];  Service: Orthopedics;  Laterality: Right;   TONSILLECTOMY     Patient Active Problem List   Diagnosis Date Noted   Medication management 02/26/2023   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) 02/26/2023   Acute medial meniscal tear 10/03/2013    PCP: Georgann Housekeeper, MD  REFERRING PROVIDER: Lyn Henri, MD  REFERRING DIAG: N94.10 (ICD-10-CM) - Unspecified dyspareunia   THERAPY DIAG:  Other muscle spasm  Other lack of coordination  Muscle weakness (generalized)  Rationale for Evaluation and Treatment: Rehabilitation  ONSET DATE: 2024- year ago  SUBJECTIVE:                                                                                                                                                                                           SUBJECTIVE STATEMENT: Pt reports that intercourse is painful, she uses oral estrogen. Might hurt from lack of frequency. The other issue is that she has to rush to the bathroom when she has  to urinate,  She would like to address both of these issues.   Fluid intake: Yes: water , coffee, alcohol occasionally  - sparkling water  PAIN:  Are you having pain? No  PRECAUTIONS: None  RED FLAGS: None   WEIGHT BEARING RESTRICTIONS: No  FALLS:  Has patient fallen in last 6 months? No  LIVING ENVIRONMENT: Lives with: lives with their spouse Lives in: House/apartment Stairs: No Has following equipment at home: None  OCCUPATION: retired  PLOF: Independent  PATIENT GOALS: to have reduced urgency and less pain with intercourse  PERTINENT HISTORY:  acustic neuroma's removed      BONE FLAP RECONSTRUCTION SKULL   titanium plate   BREAST REDUCTION SURGERY      CESAREAN SECTION   x2   KNEE ARTHROSCOPY       Sexual abuse: No  BOWEL MOVEMENT: no issues   URINATION: Pain with urination: No Fully empty bladder: Yes:   Stream: Strong Urgency:yes Frequency: no Leakage:  running to the bathroom at times Pads: No  INTERCOURSE: Pain with intercourse: Initial Penetration Ability to have vaginal penetration:  Yes: with discomfort Climax: yes Marinoff Scale: 1/3  PREGNANCY: Vaginal deliveries 0 Tearing No C-section deliveries 2 Currently pregnant No  PROLAPSE: None   OBJECTIVE:  Note: Objective measures were completed at Evaluation unless otherwise noted.   COGNITION: Overall cognitive status: Within functional limits for tasks assessed     SENSATION: Light touch: Appears intact Proprioception: Appears intact  MUSCLE LENGTH: Hamstrings: Right 80 deg; Left 80 deg LUMBAR SPECIAL TESTS:  Straight leg raise test: Negative  POSTURE: No Significant postural limitations  PELVIC ALIGNMENT: seems even  LUMBARAROM/PROM: grossly WFL   LOWER EXTREMITY ROM: some tightness throughout  LOWER EXTREMITY MMT: at least 4/5 throughout  PALPATION:   General  no tenderness C section scars                External Perineal Exam within functional limitations                               Internal Pelvic Floor tight and tender layer 1  Patient confirms identification and approves PT to assess internal pelvic floor and treatment Yes  PELVIC MMT:   MMT eval  Vaginal 3/5  Internal Anal Sphincter   External Anal Sphincter   Puborectalis   Diastasis Recti no  (Blank rows = not tested)        TONE: high  PROLAPSE: Anterior vaginal wall laxity in hooklying, not checked in standing today  TODAY'S TREATMENT:                                                                                                                              DATE: 10/18/23   EVAL see below   PATIENT EDUCATION:  Education details: relevant anatomy, lubricants, pelvic wand, perineal massage education, vaginal estrogen superficially, bladder irritants Person educated: Patient Education method: Explanation, Demonstration, Tactile cues, Verbal cues, and Handouts Education comprehension: verbalized understanding and needs further education  HOME EXERCISE PROGRAM: Diaphragmatic breathing, pelvic floor lengthening next  ASSESSMENT:  CLINICAL IMPRESSION: Patient is a 73 y.o. F who was seen today for physical therapy evaluation and treatment for superficial dyspareunia and urge incontinence and urgency. Educated her on reducing bladder irritants, vaginal cream more superficially, pelvic floor muscle release with pelvic wand . She has tight and tender layer one  and 2 pelvic floor and will benefit from PT to reduce urgency, urge incontinence and dyspareunia.   OBJECTIVE IMPAIRMENTS: decreased ROM, decreased strength, increased muscle spasms, impaired tone, and pain.   ACTIVITY LIMITATIONS: continence and toileting  PARTICIPATION LIMITATIONS: interpersonal relationship and community activity  PERSONAL FACTORS: Age and Time since onset of injury/illness/exacerbation are also affecting patient's functional outcome.   REHAB POTENTIAL: Good  CLINICAL DECISION MAKING:  Stable/uncomplicated  EVALUATION COMPLEXITY: Low   GOALS: Goals reviewed with patient? Yes  SHORT TERM GOALS: Target date: 11/15/2023    Pt will be I with her initial HEP Baseline: Goal status: INITIAL  2.  Pt will be I with pelvic wand and perineal massage Baseline:  Goal status: INITIAL  3.  Pt will be I with healthy bladder habits Baseline:  Goal status: INITIAL  4.  Pt will reduce bladder irritants Baseline:  Goal status: INITIAL   LONG TERM GOALS: Target date: 12/13/2023    Pt will report no pain with intercourse Baseline:  Goal status: INITIAL  2.  Pt will soak 0 pads/ day Baseline:  Goal status: INITIAL  3.  Pt will report healthy bladder habits 100% of the time Baseline:  Goal status: INITIAL  4.  Pt will be I with her advanced HEP Baseline:  Goal status: INITIAL   PLAN:  PT FREQUENCY: 1-2x/week  PT DURATION: 8 weeks  PLANNED INTERVENTIONS: 97110-Therapeutic exercises, 97530- Therapeutic activity, 97112- Neuromuscular re-education, 97535- Self Care, 16109- Manual therapy, Taping, Dry Needling, Joint mobilization, Joint manipulation, Spinal manipulation, Spinal mobilization, Scar mobilization, and Moist heat  PLAN FOR NEXT SESSION: urge drill  Lanah Steines, PT 10/18/23 5:19 PM

## 2023-10-18 ENCOUNTER — Ambulatory Visit: Payer: Medicare Other | Attending: Obstetrics and Gynecology | Admitting: Physical Therapy

## 2023-10-18 ENCOUNTER — Other Ambulatory Visit: Payer: Self-pay

## 2023-10-18 ENCOUNTER — Encounter: Payer: Self-pay | Admitting: Physical Therapy

## 2023-10-18 DIAGNOSIS — M6281 Muscle weakness (generalized): Secondary | ICD-10-CM | POA: Insufficient documentation

## 2023-10-18 DIAGNOSIS — R278 Other lack of coordination: Secondary | ICD-10-CM | POA: Insufficient documentation

## 2023-10-18 DIAGNOSIS — M62838 Other muscle spasm: Secondary | ICD-10-CM | POA: Insufficient documentation

## 2023-10-25 ENCOUNTER — Ambulatory Visit: Payer: Medicare Other | Attending: Obstetrics and Gynecology | Admitting: Physical Therapy

## 2023-10-25 ENCOUNTER — Encounter: Payer: Self-pay | Admitting: Physical Therapy

## 2023-10-25 DIAGNOSIS — R278 Other lack of coordination: Secondary | ICD-10-CM | POA: Insufficient documentation

## 2023-10-25 DIAGNOSIS — M62838 Other muscle spasm: Secondary | ICD-10-CM | POA: Diagnosis present

## 2023-10-25 DIAGNOSIS — M6281 Muscle weakness (generalized): Secondary | ICD-10-CM | POA: Diagnosis present

## 2023-10-25 NOTE — Therapy (Signed)
 OUTPATIENT PHYSICAL THERAPY FEMALE PELVIC TREATMENT   Patient Name: Linda Braun MRN: [id number] DOB:June 19, 1951, 73 y.o., female Today's Date: 10/25/2023  END OF SESSION:  PT End of Session - 10/25/23 1624     Visit Number 2    Authorization Time Period 1/26-3/25/25    PT Start Time 1530    PT Stop Time 1623    PT Time Calculation (min) 53 min    Activity Tolerance Patient tolerated treatment well    Behavior During Therapy [facility] for tasks assessed/performed              Past Medical History:  Diagnosis Date   Anxiety    Meniscus tear    rt knee   Past Surgical History:  Procedure Laterality Date   acustic neuroma's removed     BONE FLAP RECONSTRUCTION SKULL     titanium plate   BREAST REDUCTION SURGERY     CESAREAN SECTION     x2   KNEE ARTHROSCOPY Right 10/03/2013   Procedure: RIGHT ARTHROSCOPY KNEE WITH DEBRIDEMENT;  Surgeon: Dempsey LULLA Moan, MD;  Location: [facility];  Service: Orthopedics;  Laterality: Right;   TONSILLECTOMY     Patient Active Problem List   Diagnosis Date Noted   Medication management 02/26/2023   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) 02/26/2023   Acute medial meniscal tear 10/03/2013    PCP: Ransom Other, MD  REFERRING PROVIDER: Lequita Evalene LABOR, MD  REFERRING DIAG: N94.10 (ICD-10-CM) - Unspecified dyspareunia   THERAPY DIAG:  Other muscle spasm  Other lack of coordination  Muscle weakness (generalized)  Rationale for Evaluation and Treatment: Rehabilitation  ONSET DATE: 2024- year ago  SUBJECTIVE:                                                                                                                                                                                           SUBJECTIVE STATEMENT: Pt reports that she tried the vaginal wand, her muscles were burning a little.  Is wondering about which lubricant to use for intercourse and wand   Fluid intake: Yes: water , coffee, alcohol occasionally  -  sparkling water  PAIN:  Are you having pain? No  PRECAUTIONS: None  RED FLAGS: None   WEIGHT BEARING RESTRICTIONS: No  FALLS:  Has patient fallen in last 6 months? No  LIVING ENVIRONMENT: Lives with: lives with their spouse Lives in: House/apartment Stairs: No Has following equipment at home: None  OCCUPATION: retired  PLOF: Independent  PATIENT GOALS: to have reduced urgency and less pain with intercourse  PERTINENT HISTORY:  acustic neuroma's removed  BONE FLAP RECONSTRUCTION SKULL   titanium plate   BREAST REDUCTION SURGERY      CESAREAN SECTION   x2   KNEE ARTHROSCOPY       Sexual abuse: No  BOWEL MOVEMENT: no issues   URINATION: Pain with urination: No Fully empty bladder: Yes:   Stream: Strong Urgency:yes Frequency: no Leakage:  running to the bathroom at times Pads: No  INTERCOURSE: Pain with intercourse: Initial Penetration Ability to have vaginal penetration:  Yes: with discomfort Climax: yes Marinoff Scale: 1/3  PREGNANCY: Vaginal deliveries 0 Tearing No C-section deliveries 2 Currently pregnant No  PROLAPSE: None   OBJECTIVE:  Note: Objective measures were completed at Evaluation unless otherwise noted.   COGNITION: Overall cognitive status: Within functional limits for tasks assessed     SENSATION: Light touch: Appears intact Proprioception: Appears intact  MUSCLE LENGTH: Hamstrings: Right 80 deg; Left 80 deg LUMBAR SPECIAL TESTS:  Straight leg raise test: Negative  POSTURE: No Significant postural limitations  PELVIC ALIGNMENT: seems even  LUMBARAROM/PROM: grossly WFL   LOWER EXTREMITY ROM: some tightness throughout  LOWER EXTREMITY MMT: at least 4/5 throughout  PALPATION:   General  no tenderness C section scars                External Perineal Exam within functional limitations                              Internal Pelvic Floor tight and tender layer 1  Patient confirms identification and approves PT  to assess internal pelvic floor and treatment Yes  PELVIC MMT:   MMT eval  Vaginal 3/5  Internal Anal Sphincter   External Anal Sphincter   Puborectalis   Diastasis Recti no  (Blank rows = not tested)        TONE: high  PROLAPSE: Anterior vaginal wall laxity in hooklying, not checked in standing today  TODAY'S TREATMENT:                                                                                                                              DATE: 10/25/23      Neuro reed- diaphragmatic breathing Ball press with transverse abdominis breath  Wall press with transverse abdominis breath-difficult   Therapeutic activities- urge suppression strategies                                      Wand/ lubricants ( IR video)       PATIENT EDUCATION:  Education details: relevant anatomy, lubricants, pelvic wand, perineal massage education, vaginal estrogen superficially, bladder irritants Person educated: Patient Education method: Explanation, Demonstration, Tactile cues, Verbal cues, and Handouts Education comprehension: verbalized understanding and needs further education  HOME EXERCISE PROGRAM: Diaphragmatic breathing, pelvic floor lengthening next  ASSESSMENT:  CLINICAL IMPRESSION: Pt with difficulty with transverse  abdominis breath with her exercises, dem paradoxical breathing pattern. Discussed taking a day off vaginal wand pelvic floor muscle release d/t irritation. Pt educated on lubricants to use for intercourse and on urge suppression drill.   OBJECTIVE IMPAIRMENTS: decreased ROM, decreased strength, increased muscle spasms, impaired tone, and pain.   ACTIVITY LIMITATIONS: continence and toileting  PARTICIPATION LIMITATIONS: interpersonal relationship and community activity  PERSONAL FACTORS: Age and Time since onset of injury/illness/exacerbation are also affecting patient's functional outcome.   REHAB POTENTIAL: Good  CLINICAL DECISION MAKING:  Stable/uncomplicated  EVALUATION COMPLEXITY: Low   GOALS: Goals reviewed with patient? Yes  SHORT TERM GOALS: Target date: 11/15/2023    Pt will be I with her initial HEP Baseline: Goal status: INITIAL  2.  Pt will be I with pelvic wand and perineal massage Baseline:  Goal status: INITIAL  3.  Pt will be I with healthy bladder habits Baseline:  Goal status: INITIAL  4.  Pt will reduce bladder irritants Baseline:  Goal status: INITIAL   LONG TERM GOALS: Target date: 12/13/2023    Pt will report no pain with intercourse Baseline:  Goal status: INITIAL  2.  Pt will soak 0 pads/ day Baseline:  Goal status: INITIAL  3.  Pt will report healthy bladder habits 100% of the time Baseline:  Goal status: INITIAL  4.  Pt will be I with her advanced HEP Baseline:  Goal status: INITIAL   PLAN:  PT FREQUENCY: 1-2x/week  PT DURATION: 8 weeks  PLANNED INTERVENTIONS: 97110-Therapeutic exercises, 97530- Therapeutic activity, 97112- Neuromuscular re-education, 97535- Self Care, [id number]- Manual therapy, Taping, Dry Needling, Joint mobilization, Joint manipulation, Spinal manipulation, Spinal mobilization, Scar mobilization, and Moist heat  PLAN FOR NEXT SESSION: downtraining exercises  Jeanise Durfey, PT 10/25/23 4:25 PM

## 2023-10-25 NOTE — Patient Instructions (Signed)
  Lubrication Used for intercourse to reduce friction Avoid ones that have glycerin, nonoxynol-9, petroleum, propylene glycol, chlorhexidine gluconate, warming gels, tingling gels, icing or cooling gel, scented Avoid parabens due to a preservative similar to female sex hormone May need to be reapplied once or several times during sexual activity Can be applied to both partners genitals prior to vaginal penetration to minimize friction or irritation Prevent irritation and mucosal tears that cause post coital pain and increased the risk of vaginal and urinary tract infections Oil-based lubricants cannot be used with condoms due to breaking them down.  Least likely to irritate vaginal tissue.  Plant based-lubes are safe Silicone-based lubrication are thicker and last long and used for post-menopausal women  Vaginal Lubricators Here is a list of some suggested lubricators you can use for intercourse. Use the most hypoallergenic product.  You can place on you or your partner.  Slippery Stuff ( water based) Sylk or Sliquid Natural H2O ( good  if frequent UTI's)- walmart, amazon Sliquid organics silk-(aloe and silicone based ) [employer] (www.blossom-organics.com)- (aloe based ) Coconut oil, olive oil -not good with condoms  PJur Woman Nude- (water based) amazon Uberlube- ( silicon) Amazon Aloe Vera- Sprouts has an organic one Yes lubricant- (water based and has plant oil based similar to silicone) [employer] Platinum-Silicone, Target, [facility] Olive and Bee intimate cream-  www.oliveandbee.com.au Pink - [employer] Erosense Sync- walmart, amazon Coconu- coconu.com Desert [employer] Good Clean Love lubricants  Things to avoid in lubricants are glycerin, warming gels, tingling gels, icing or cooling  gels, and scented gels.  Also avoid Vaseline. KY jelly,  and Astroglide contain chlorhexidine which kills good bacteria(lactobacilli)  Things to avoid in the vaginal area Do not use  things to irritate the vulvar area No lotions- see below Soaps you  can use :Aveeno, [place], Good Clean Love cleanser if needed. Must be gentle No deodorants No douches Good to sleep without underwear to let the vaginal area to air out No scrubbing: spread the lips to let warm water rinse over labias and pat dry  Creams that can be used on the Vulva Area V [employer], walmart Vital V [place] Salve Julva- [employer] Botanical Pro-Meno Wild [place] Cream Coconut oil, olive oil Cleo by [employer] labial moisturizer -Amazon,  Desert [place] Releveum ( lidocaine) or Desert [employer] Yes Moisturizer

## 2023-11-01 ENCOUNTER — Encounter: Payer: Self-pay | Admitting: Cardiology

## 2023-11-01 ENCOUNTER — Encounter: Payer: Medicare Other | Admitting: Physical Therapy

## 2023-11-01 ENCOUNTER — Ambulatory Visit: Payer: Medicare Other | Attending: Cardiology | Admitting: Cardiology

## 2023-11-01 VITALS — BP 128/80 | HR 75 | Ht 63.0 in | Wt 156.6 lb

## 2023-11-01 DIAGNOSIS — E7800 Pure hypercholesterolemia, unspecified: Secondary | ICD-10-CM | POA: Diagnosis present

## 2023-11-01 DIAGNOSIS — I2510 Atherosclerotic heart disease of native coronary artery without angina pectoris: Secondary | ICD-10-CM | POA: Insufficient documentation

## 2023-11-01 DIAGNOSIS — Z7689 Persons encountering health services in other specified circumstances: Secondary | ICD-10-CM | POA: Insufficient documentation

## 2023-11-01 MED ORDER — ROSUVASTATIN CALCIUM 10 MG PO TABS: 10.0000 mg | ORAL_TABLET | Freq: Every day | ORAL | 3 refills | Status: DC

## 2023-11-01 MED ORDER — ASPIRIN 81 MG PO TBEC: 81.0000 mg | DELAYED_RELEASE_TABLET | ORAL | Status: AC

## 2023-11-01 NOTE — Patient Instructions (Signed)
Medication Instructions:  Please start Crestor 10 mg a day. Take Asprin 81 mg every other day. Continue all other medications as listed.  *If you need a refill on your cardiac medications before your next appointment, please call your pharmacy*   Follow-Up: At [facility], you and your health needs are our priority.  As part of our continuing mission to provide you with exceptional heart care, we have created designated Provider Care Teams.  These Care Teams include your primary Cardiologist (physician) and Advanced Practice Providers (APPs -  Physician Assistants and Nurse Practitioners) who all work together to provide you with the care you need, when you need it.  We recommend signing up for the patient portal called "MyChart".  Sign up information is provided on this After Visit Summary.  MyChart is used to connect with patients for Virtual Visits (Telemedicine).  Patients are able to view lab/test results, encounter notes, upcoming appointments, etc.  Non-urgent messages can be sent to your provider as well.   To learn more about what you can do with MyChart, go to [email or web address].    Your next appointment:   Follow up as needed with Dr Anne Fu

## 2023-11-01 NOTE — Progress Notes (Signed)
Cardiology Office Note:  .   Date:  11/01/2023  ID:  Linda Braun, DOB 1951-07-24, MRN [id number] PCP: Georgann Housekeeper, MD  [facility] Providers Cardiologist:  Donato Schultz, MD     History of Present Illness: .   Linda Braun is a 73 y.o. female Discussed with the use of AI scribe   History of Present Illness   Linda Braun is a 73 year old female who presents with an elevated coronary calcium score.  She underwent a coronary calcium score test on July 04, 2023, which revealed aortic atherosclerosis with a total calcium score of 5, placing her in the 44th percentile. No chest pain is reported. An exercise treadmill test in 2018 showed nonspecific ST-T wave changes pre and post-exercise, with her completing seven minutes and achieving 8.5 METs.  She has a history of hypertension, managed with losartan hydrochlorothiazide 100/25 mg once daily. Recent laboratory results show a creatinine level of 0.67, hemoglobin of 14.2, and potassium of 3.9.  She experiences difficulty maintaining her weight despite regular physical activity, which includes walking and training twice a week. She has a partially replaced right knee, causing discomfort, particularly on the treadmill.  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 The initial tumor was discovered approximately 25-26 years ago following left-sided hearing loss, with a residual cell removed two years later after facial discomfort.  Family history reveals her mother had a stroke at 61 and is currently 72 years old, while her father had a mild heart attack at 3 and passed away five years ago.      We see her husband as well.     ROS: No CP, no SOB. Prior office note from Dr. Donette Larry reviewed-obtained Ca score.   Studies Reviewed: Marland Kitchen   EKG Interpretation Date/Time:  Tuesday November 01 2023 09:33:03 EST Ventricular Rate:  70 PR Interval:  168 QRS Duration:  82 QT  Interval:  380 QTC Calculation: 410 R Axis:   82  Text Interpretation: Normal sinus rhythm T wave abnormality, consider inferior ischemia When compared with ECG of 21-Feb-1999 16:36, T wave inversion now evident in Inferior leads Nonspecific T wave abnormality now evident in Anterolateral leads Confirmed by Donato Schultz ([id number]) on 11/01/2023 9:34:55 AM    Results   LABS Creatinine: 0.67 mg/dL Hemoglobin: 46.9 g/dL Potassium: 3.9 mmol/L  RADIOLOGY Coronary calcium score: Aortic atherosclerosis, total calcium score of 5 (07/04/2023)  DIAGNOSTIC Exercise treadmill test: Completed 7 minutes, achieved 8.5 METs, nonspecific ST-T wave changes pre and post (2018)     Risk Assessment/Calculations:            Physical Exam:   VS:  BP 128/80   Pulse 75   Ht 5\' 3"  (1.6 m)   Wt 156 lb 9.6 oz (71 kg)   SpO2 95%   BMI 27.74 kg/m    Wt Readings from Last 3 Encounters:  11/01/23 156 lb 9.6 oz (71 kg)  03/21/23 154 lb (69.9 kg)  02/24/23 154 lb (69.9 kg)    GEN: Well nourished, well developed in no acute distress NECK: No JVD; No carotid bruits, Left ear deaffness CARDIAC: RRR, no murmurs, no rubs, no gallops RESPIRATORY:  Clear to auscultation without rales, wheezing or rhonchi  ABDOMEN: Soft, non-tender, non-distended EXTREMITIES:  No edema; No deformity   ASSESSMENT AND PLAN: .    Assessment and Plan    Elevated Coronary Calcium Score Coronary calcium score of 544  indicates aortic atherosclerosis, increasing cardiovascular risk. Patient is asymptomatic with good exercise tolerance and no chest pain during a 2018 treadmill test. Discussed rosuvastatin (Crestor) to reduce LDL below 70 and prevent plaque rupture and heart attack. Potential side effects include muscle aches and flu-like symptoms. - Prescribe rosuvastatin (Crestor) 10 mg daily - Recheck cholesterol levels in September 2025 with Dr. Donette Larry - Monitor for adverse effects such as muscle aches or flu-like  symptoms  Hypertension Hypertension managed with losartan hydrochlorothiazide 100/25 mg daily. Blood pressure control is good. - Continue losartan hydrochlorothiazide 100/25 mg daily - Monitor blood pressure regularly  Weight Management Patient reports difficulty maintaining weight despite regular exercise. Interested in nutritional support. Discussed [employer] [facility] for nutritional consultation and trainers. - Visit [employer] for nutritional consultation and support - Continue current exercise regimen including walking and weight training  Right Knee Pain Right knee pain possibly exacerbated by treadmill use. History of partial knee replacement. 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 No further intervention required. - No current intervention required  General Health Maintenance Patient engages in regular exercise and is interested in preventive measures to avoid cardiovascular events. - Encourage continued regular exercise - Promote healthy diet and lifestyle choices - Consider annual flu vaccination and other age-appropriate screenings  Follow-up - PRN follow-up with cardiologist. Please let us know if further assistance is needed. - Dr. Donette Larry to manage refills and monitor cholesterol levels in September 2025.               Signed, Donato Schultz, MD

## 2023-11-08 ENCOUNTER — Encounter: Payer: Self-pay | Admitting: Physical Therapy

## 2023-11-08 ENCOUNTER — Ambulatory Visit: Payer: Medicare Other | Admitting: Physical Therapy

## 2023-11-08 DIAGNOSIS — M62838 Other muscle spasm: Secondary | ICD-10-CM

## 2023-11-08 DIAGNOSIS — R278 Other lack of coordination: Secondary | ICD-10-CM

## 2023-11-08 DIAGNOSIS — M6281 Muscle weakness (generalized): Secondary | ICD-10-CM

## 2023-11-08 NOTE — Therapy (Signed)
OUTPATIENT PHYSICAL THERAPY FEMALE PELVIC TREATMENT   Patient Name: Linda Braun MRN: [id number] DOB:Feb 06, 1951, 73 y.o., female Today's Date: 11/08/2023  END OF SESSION:  PT End of Session - 11/08/23 1539     Visit Number 3    Authorization Type MEDICARE PART A AND B    Authorization Time Period 1/26-3/25/25    PT Start Time 0335    PT Stop Time 0415    PT Time Calculation (min) 40 min    Activity Tolerance Patient tolerated treatment well    Behavior During Therapy [facility] for tasks assessed/performed               Past Medical History:  Diagnosis Date   Anxiety    Meniscus tear    rt knee   Past Surgical History:  Procedure Laterality Date   acustic neuroma's removed     BONE FLAP RECONSTRUCTION SKULL     titanium plate   BREAST REDUCTION SURGERY     CESAREAN SECTION     x2   KNEE ARTHROSCOPY Right 10/03/2013   Procedure: RIGHT ARTHROSCOPY KNEE WITH DEBRIDEMENT;  Surgeon: Loanne Drilling, MD;  Location: [facility];  Service: Orthopedics;  Laterality: Right;   TONSILLECTOMY     Patient Active Problem List   Diagnosis Date Noted   Medication management 02/26/2023   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) 02/26/2023   Acute medial meniscal tear 10/03/2013    PCP: Georgann Housekeeper, MD  REFERRING PROVIDER: Lyn Henri, MD  REFERRING DIAG: N94.10 (ICD-10-CM) - Unspecified dyspareunia   THERAPY DIAG:  Other muscle spasm  Other lack of coordination  Muscle weakness (generalized)  Rationale for Evaluation and Treatment: Rehabilitation  ONSET DATE: 2024- year ago  SUBJECTIVE:                                                                                                                                                                                           SUBJECTIVE STATEMENT: Pt reports that she has a calcium score test. Has not had opportunity to have sex.  Leaked once when she had urgency- yesterday Worked out at [employer] today with  personal trainer     Fluid intake: Yes: water , coffee, alcohol occasionally  - sparkling water  PAIN:  Are you having pain? No  PRECAUTIONS: None  RED FLAGS: None   WEIGHT BEARING RESTRICTIONS: No  FALLS:  Has patient fallen in last 6 months? No  LIVING ENVIRONMENT: Lives with: lives with their spouse Lives in: House/apartment Stairs: No Has following equipment at home: None  OCCUPATION: retired  PLOF: Independent  PATIENT GOALS:  to have reduced urgency and less pain with intercourse  PERTINENT HISTORY:  acustic neuroma's removed      BONE FLAP RECONSTRUCTION SKULL   titanium plate   BREAST REDUCTION SURGERY      CESAREAN SECTION   x2   KNEE ARTHROSCOPY       Sexual abuse: No  BOWEL MOVEMENT: no issues   URINATION: Pain with urination: No Fully empty bladder: Yes:   Stream: Strong Urgency:yes Frequency: no Leakage:  running to the bathroom at times Pads: No  INTERCOURSE: Pain with intercourse: Initial Penetration Ability to have vaginal penetration:  Yes: with discomfort Climax: yes Marinoff Scale: 1/3  PREGNANCY: Vaginal deliveries 0 Tearing No C-section deliveries 2 Currently pregnant No  PROLAPSE: None   OBJECTIVE:  Note: Objective measures were completed at Evaluation unless otherwise noted.   COGNITION: Overall cognitive status: Within functional limits for tasks assessed     SENSATION: Light touch: Appears intact Proprioception: Appears intact  MUSCLE LENGTH: Hamstrings: Right 80 deg; Left 80 deg LUMBAR SPECIAL TESTS:  Straight leg raise test: Negative  POSTURE: No Significant postural limitations  PELVIC ALIGNMENT: seems even  LUMBARAROM/PROM: grossly WFL   LOWER EXTREMITY ROM: some tightness throughout  LOWER EXTREMITY MMT: at least 4/5 throughout  PALPATION:   General  no tenderness C section scars                External Perineal Exam within functional limitations                              Internal  Pelvic Floor tight and tender layer 1  Patient confirms identification and approves PT to assess internal pelvic floor and treatment Yes  PELVIC MMT:   MMT eval  Vaginal 3/5  Internal Anal Sphincter   External Anal Sphincter   Puborectalis   Diastasis Recti no  (Blank rows = not tested)        TONE: high  PROLAPSE: Anterior vaginal wall laxity in hooklying, not checked in standing today  TODAY'S TREATMENT:                                                                                                                              DATE: 11/08/23      Neuro reed- diaphragmatic breathing Ball press with transverse abdominis breath supine Shoulder extension with transverse abdominis breath GREEN THERABAND Hip adduction with ball Rowing GREEN THERABAND            PATIENT EDUCATION:  Education details: relevant anatomy, lubricants, pelvic wand, perineal massage education, vaginal estrogen superficially, bladder irritants Person educated: Patient Education method: Explanation, Demonstration, Tactile cues, Verbal cues, and Handouts Education comprehension: verbalized understanding and needs further education  HOME EXERCISE PROGRAM: [id number]  ASSESSMENT:  CLINICAL IMPRESSION: Pt did well with her exercises. Improved coordination of core and breath. Pt will cont to benefit from cont PT.   OBJECTIVE IMPAIRMENTS:  decreased ROM, decreased strength, increased muscle spasms, impaired tone, and pain.   ACTIVITY LIMITATIONS: continence and toileting  PARTICIPATION LIMITATIONS: interpersonal relationship and community activity  PERSONAL FACTORS: Age and Time since onset of injury/illness/exacerbation are also affecting patient's functional outcome.   REHAB POTENTIAL: Good  CLINICAL DECISION MAKING: Stable/uncomplicated  EVALUATION COMPLEXITY: Low   GOALS: Goals reviewed with patient? Yes  SHORT TERM GOALS: Target date: 11/15/2023    Pt will be I with her  initial HEP Baseline: Goal status: INITIAL  2.  Pt will be I with pelvic wand and perineal massage Baseline:  Goal status: INITIAL  3.  Pt will be I with healthy bladder habits Baseline:  Goal status: INITIAL  4.  Pt will reduce bladder irritants Baseline:  Goal status: INITIAL   LONG TERM GOALS: Target date: 12/13/2023    Pt will report no pain with intercourse Baseline:  Goal status: INITIAL  2.  Pt will soak 0 pads/ day Baseline:  Goal status: INITIAL  3.  Pt will report healthy bladder habits 100% of the time Baseline:  Goal status: INITIAL  4.  Pt will be I with her advanced HEP Baseline:  Goal status: INITIAL   PLAN:  PT FREQUENCY: 1-2x/week  PT DURATION: 8 weeks  PLANNED INTERVENTIONS: 97110-Therapeutic exercises, 97530- Therapeutic activity, 97112- Neuromuscular re-education, 97535- Self Care, 96045- Manual therapy, Taping, Dry Needling, Joint mobilization, Joint manipulation, Spinal manipulation, Spinal mobilization, Scar mobilization, and Moist heat  PLAN FOR NEXT SESSION: cont exercises  Gopal Malter, PT 11/08/23 4:09 PM

## 2023-11-15 ENCOUNTER — Encounter: Payer: Medicare Other | Admitting: Physical Therapy

## 2023-11-22 ENCOUNTER — Encounter: Payer: Self-pay | Admitting: Physical Therapy

## 2023-11-22 ENCOUNTER — Ambulatory Visit: Attending: Obstetrics and Gynecology | Admitting: Physical Therapy

## 2023-11-22 DIAGNOSIS — M6281 Muscle weakness (generalized): Secondary | ICD-10-CM | POA: Insufficient documentation

## 2023-11-22 DIAGNOSIS — M62838 Other muscle spasm: Secondary | ICD-10-CM | POA: Insufficient documentation

## 2023-11-22 DIAGNOSIS — R278 Other lack of coordination: Secondary | ICD-10-CM | POA: Insufficient documentation

## 2023-11-22 NOTE — Therapy (Signed)
 OUTPATIENT PHYSICAL THERAPY FEMALE PELVIC TREATMENT   Patient Name: Linda Braun MRN: [id number] DOB:Jan 29, 1951, 73 y.o., female Today's Date: 11/22/2023  END OF SESSION:  PT End of Session - 11/22/23 0848     Visit Number 4    Authorization Type MEDICARE PART A AND B    Authorization Time Period 1/26-3/25/25    PT Start Time 0845    PT Stop Time 0930    PT Time Calculation (min) 45 min    Activity Tolerance Patient tolerated treatment well    Behavior During Therapy [facility] for tasks assessed/performed                Past Medical History:  Diagnosis Date   Anxiety    Meniscus tear    rt knee   Past Surgical History:  Procedure Laterality Date   acustic neuroma's removed     BONE FLAP RECONSTRUCTION SKULL     titanium plate   BREAST REDUCTION SURGERY     CESAREAN SECTION     x2   KNEE ARTHROSCOPY Right 10/03/2013   Procedure: RIGHT ARTHROSCOPY KNEE WITH DEBRIDEMENT;  Surgeon: Loanne Drilling, MD;  Location: [facility];  Service: Orthopedics;  Laterality: Right;   TONSILLECTOMY     Patient Active Problem List   Diagnosis Date Noted   Medication management 02/26/2023   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) 02/26/2023   Acute medial meniscal tear 10/03/2013    PCP: Georgann Housekeeper, MD  REFERRING PROVIDER: Lyn Henri, MD  REFERRING DIAG: N94.10 (ICD-10-CM) - Unspecified dyspareunia   THERAPY DIAG:  Other muscle spasm  Other lack of coordination  Muscle weakness (generalized)  Rationale for Evaluation and Treatment: Rehabilitation  ONSET DATE: 2024- year ago  SUBJECTIVE:                                                                                                                                                                                           SUBJECTIVE STATEMENT: Pt reports that she feels like she is getting a UTI. She has not been sexually active d/t her husband's  surgery. Is wondering about which lubricant to use Reports  that that she has been doing her estradiol cream on schedule .  Urgency is better Has to be aware of the clock and not wait extra 30 mins to go pee.when she needs to pee Leaks with urge- when it has been over 2 hours,  Still drinks bladder irriants Gets up 1 time at night     Fluid intake: Yes: water , coffee, alcohol occasionally  - sparkling water  PAIN:  Are you having  pain? No  PRECAUTIONS: None  RED FLAGS: None   WEIGHT BEARING RESTRICTIONS: No  FALLS:  Has patient fallen in last 6 months? No  LIVING ENVIRONMENT: Lives with: lives with their spouse Lives in: House/apartment Stairs: No Has following equipment at home: None  OCCUPATION: retired  PLOF: Independent  PATIENT GOALS: to have reduced urgency and less pain with intercourse  PERTINENT HISTORY:  acustic neuroma's removed      BONE FLAP RECONSTRUCTION SKULL   titanium plate   BREAST REDUCTION SURGERY      CESAREAN SECTION   x2   KNEE ARTHROSCOPY       Sexual abuse: No  BOWEL MOVEMENT: no issues   URINATION: Pain with urination: No Fully empty bladder: Yes:   Stream: Strong Urgency:yes Frequency: no Leakage:  running to the bathroom at times Pads: No  INTERCOURSE: Pain with intercourse: Initial Penetration Ability to have vaginal penetration:  Yes: with discomfort Climax: yes Marinoff Scale: 1/3  PREGNANCY: Vaginal deliveries 0 Tearing No C-section deliveries 2 Currently pregnant No  PROLAPSE: None   OBJECTIVE:  Note: Objective measures were completed at Evaluation unless otherwise noted.   COGNITION: Overall cognitive status: Within functional limits for tasks assessed     SENSATION: Light touch: Appears intact Proprioception: Appears intact  MUSCLE LENGTH: Hamstrings: Right 80 deg; Left 80 deg LUMBAR SPECIAL TESTS:  Straight leg raise test: Negative  POSTURE: No Significant postural limitations  PELVIC ALIGNMENT: seems even  LUMBARAROM/PROM: grossly  WFL   LOWER EXTREMITY ROM: some tightness throughout  LOWER EXTREMITY MMT: at least 4/5 throughout  PALPATION:   General  no tenderness C section scars                External Perineal Exam within functional limitations                              Internal Pelvic Floor tight and tender layer 1  Patient confirms identification and approves PT to assess internal pelvic floor and treatment Yes  PELVIC MMT:   MMT eval  Vaginal 3/5  Internal Anal Sphincter   External Anal Sphincter   Puborectalis   Diastasis Recti no  (Blank rows = not tested)        TONE: high  PROLAPSE: Anterior vaginal wall laxity in hooklying, not checked in standing today  TODAY'S TREATMENT:                                                                                                                              DATE: 11/22/23      Neuro reed- diaphragmatic breathing Ball press with transverse abdominis breath supine    There acts- review of lubricants                     Urge drill  Review of HEP and progress                            PATIENT EDUCATION:  Education details: relevant anatomy, lubricants, pelvic wand, perineal massage education, vaginal estrogen superficially, bladder irritants Person educated: Patient Education method: Explanation, Demonstration, Tactile cues, Verbal cues, and Handouts Education comprehension: verbalized understanding and needs further education  HOME EXERCISE PROGRAM: [id number]  ASSESSMENT:  CLINICAL IMPRESSION: Pt did well with her exercises, urge drill, neuro reed. Req VC's for core coordination. Pt will continue to benefit from PT.  OBJECTIVE IMPAIRMENTS: decreased ROM, decreased strength, increased muscle spasms, impaired tone, and pain.   ACTIVITY LIMITATIONS: continence and toileting  PARTICIPATION LIMITATIONS: interpersonal relationship and community activity  PERSONAL FACTORS: Age and Time since onset of  injury/illness/exacerbation are also affecting patient's functional outcome.   REHAB POTENTIAL: Good  CLINICAL DECISION MAKING: Stable/uncomplicated  EVALUATION COMPLEXITY: Low   GOALS: Goals reviewed with patient? Yes  SHORT TERM GOALS: Target date: 11/15/2023    Pt will be I with her initial HEP Baseline: Goal status: met 2.  Pt will be I with pelvic wand and perineal massage Baseline:  Goal status: met  3.  Pt will be I with healthy bladder habits Baseline:  Goal status: met  4.  Pt will reduce bladder irritants Baseline:  Goal status: progressing  LONG TERM GOALS: Target date: 12/13/2023    Pt will report no pain with intercourse Baseline:  Goal status: INITIAL  2.  Pt will soak 0 pads/ day Baseline:  Goal status: progressing  3.  Pt will report healthy bladder habits 100% of the time Baseline:  Goal status: INITIAL  4.  Pt will be I with her advanced HEP Baseline:  Goal status: INITIAL   PLAN:  PT FREQUENCY: 1-2x/week  PT DURATION: 8 weeks  PLANNED INTERVENTIONS: 97110-Therapeutic exercises, 97530- Therapeutic activity, 97112- Neuromuscular re-education, 97535- Self Care, 54098- Manual therapy, Taping, Dry Needling, Joint mobilization, Joint manipulation, Spinal manipulation, Spinal mobilization, Scar mobilization, and Moist heat  PLAN FOR NEXT SESSION: cont exercises  Brewer Hitchman, PT 11/22/23 8:50 AM

## 2023-12-14 ENCOUNTER — Encounter: Payer: Self-pay | Admitting: Physical Therapy

## 2023-12-14 ENCOUNTER — Ambulatory Visit: Admitting: Physical Therapy

## 2023-12-14 DIAGNOSIS — M6281 Muscle weakness (generalized): Secondary | ICD-10-CM

## 2023-12-14 DIAGNOSIS — M62838 Other muscle spasm: Secondary | ICD-10-CM

## 2023-12-14 DIAGNOSIS — R278 Other lack of coordination: Secondary | ICD-10-CM

## 2023-12-14 NOTE — Therapy (Signed)
 OUTPATIENT PHYSICAL THERAPY FEMALE PELVIC TREATMENT/ reeval, POC update   Patient Name: Linda Braun MRN: [id number] DOB:[birth year], 73 y.o., female Today's Date: 12/14/2023  END OF SESSION:  PT End of Session - 12/14/23 0936     Visit Number 5    Date for PT Re-Evaluation 02/08/24    Authorization Type MEDICARE PART A AND B    Authorization Time Period waiting on auth    PT Start Time 0935    PT Stop Time 1018    PT Time Calculation (min) 43 min    Activity Tolerance Patient tolerated treatment well    Behavior During Therapy [facility] for tasks assessed/performed                 Past Medical History:  Diagnosis Date   Anxiety    Meniscus tear    rt knee   Past Surgical History:  Procedure Laterality Date   acustic neuroma's removed     BONE FLAP RECONSTRUCTION SKULL     titanium plate   BREAST REDUCTION SURGERY     CESAREAN SECTION     x2   KNEE ARTHROSCOPY Right 10/03/2013   Procedure: RIGHT ARTHROSCOPY KNEE WITH DEBRIDEMENT;  Surgeon: Loanne Drilling, MD;  Location: [facility];  Service: Orthopedics;  Laterality: Right;   TONSILLECTOMY     Patient Active Problem List   Diagnosis Date Noted   Medication management 02/26/2023   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) 02/26/2023   Acute medial meniscal tear 10/03/2013    PCP: Georgann Housekeeper, MD  REFERRING PROVIDER: Lyn Henri, MD  REFERRING DIAG: N94.10 (ICD-10-CM) - Unspecified dyspareunia   THERAPY DIAG:  Other muscle spasm - Plan: PT plan of care cert/re-cert  Other lack of coordination - Plan: PT plan of care cert/re-cert  Muscle weakness (generalized) - Plan: PT plan of care cert/re-cert  Rationale for Evaluation and Treatment: Rehabilitation  ONSET DATE: 2024- year ago  SUBJECTIVE:                                                                                                                                                                                           SUBJECTIVE  STATEMENT:  Pt reports that she has had intercourse and used a lubricant.  It was better, more comfortable.  Reports that she is consistent with her HEP Practices with the wand.  Feels like there is progress Using estradiol. Urgency has been better, still drinking carbonated water, going to bathroom more so there is not urgency. Gets up 1 time at night. Day is normal.  Leaking has been better.  Feels like she  is not drinking enough water.  Wonders is she has sleep apnea       Fluid intake: Yes: water , coffee, alcohol occasionally  - sparkling water  PAIN:  Are you having pain? No  PRECAUTIONS: None  RED FLAGS: None   WEIGHT BEARING RESTRICTIONS: No  FALLS:  Has patient fallen in last 6 months? No  LIVING ENVIRONMENT: Lives with: lives with their spouse Lives in: House/apartment Stairs: No Has following equipment at home: None  OCCUPATION: retired  PLOF: Independent  PATIENT GOALS: to have reduced urgency and less pain with intercourse  PERTINENT HISTORY:  acustic neuroma's removed      BONE FLAP RECONSTRUCTION SKULL   titanium plate   BREAST REDUCTION SURGERY      CESAREAN SECTION   x2   KNEE ARTHROSCOPY       Sexual abuse: No  BOWEL MOVEMENT: no issues   URINATION: Pain with urination: No Fully empty bladder: Yes:   Stream: Strong Urgency:yes Frequency: no Leakage:  running to the bathroom at times Pads: No  INTERCOURSE: Pain with intercourse: Initial Penetration Ability to have vaginal penetration:  Yes: with discomfort Climax: yes Marinoff Scale: 1/3  PREGNANCY: Vaginal deliveries 0 Tearing No C-section deliveries 2 Currently pregnant No  PROLAPSE: None   OBJECTIVE:  Note: Objective measures were completed at Evaluation unless otherwise noted.   COGNITION: Overall cognitive status: Within functional limits for tasks assessed     SENSATION: Light touch: Appears intact Proprioception: Appears intact  MUSCLE  LENGTH: Hamstrings: Right 80 deg; Left 80 deg LUMBAR SPECIAL TESTS:  Straight leg raise test: Negative  POSTURE: No Significant postural limitations  PELVIC ALIGNMENT: seems even  LUMBARAROM/PROM: grossly WFL   LOWER EXTREMITY ROM: some tightness throughout  LOWER EXTREMITY MMT: at least 4/5 throughout  PALPATION:   General  no tenderness C section scars                External Perineal Exam within functional limitations                              Internal Pelvic Floor tight and tender layer 1  Patient confirms identification and approves PT to assess internal pelvic floor and treatment Yes  PELVIC MMT:   MMT eval  Vaginal 3/5  Internal Anal Sphincter   External Anal Sphincter   Puborectalis   Diastasis Recti no  (Blank rows = not tested)        TONE: high  PROLAPSE: Anterior vaginal wall laxity in hooklying, not checked in standing today  TODAY'S TREATMENT:                                                                                                                              DATE: 12/14/23      Neuro reed- diaphragmatic breathing Ball press with transverse abdominis breath supine  Hip adduction  with ball Shoulder extension  with transverse abdominis breath with green theraband  There acts- review of lubricants                     Urge drill                      Review of HEP and progress                            PATIENT EDUCATION:  Education details: relevant anatomy, lubricants, pelvic wand, perineal massage education, vaginal estrogen superficially, bladder irritants Person educated: Patient Education method: Explanation, Demonstration, Tactile cues, Verbal cues, and Handouts Education comprehension: verbalized understanding and needs further education  HOME EXERCISE PROGRAM: [id number]  ASSESSMENT:  CLINICAL IMPRESSION: Pt did well with her exercises, urge drill, neuro reed. Req VC's for core coordination. Pt will continue to  benefit from PT. Reeval finished. Pt progressing well toward her goals.   OBJECTIVE IMPAIRMENTS: decreased ROM, decreased strength, increased muscle spasms, impaired tone, and pain.   ACTIVITY LIMITATIONS: continence and toileting  PARTICIPATION LIMITATIONS: interpersonal relationship and community activity  PERSONAL FACTORS: Age and Time since onset of injury/illness/exacerbation are also affecting patient's functional outcome.   REHAB POTENTIAL: Good  CLINICAL DECISION MAKING: Stable/uncomplicated  EVALUATION COMPLEXITY: Low   GOALS: Goals reviewed with patient? Yes  SHORT TERM GOALS: Target date: 11/15/2023    Pt will be I with her initial HEP Baseline: Goal status: met 2.  Pt will be I with pelvic wand and perineal massage Baseline:  Goal status: met  3.  Pt will be I with healthy bladder habits Baseline:  Goal status: met  4.  Pt will reduce bladder irritants Baseline:  Goal status: progressing  LONG TERM GOALS: Target date: 12/13/2023 updated 12/14/2023- 02/08/2024      Pt will report no pain with intercourse Baseline:  Goal status: progressing  2.  Pt will soak 0 pads/ day Baseline:  Goal status: progressing  3.  Pt will report healthy bladder habits 100% of the time Baseline:  Goal status: INITIAL  4.  Pt will be I with her advanced HEP Baseline:  Goal status: progressing   PLAN:  PT FREQUENCY: 1-2x/week  PT DURATION: 8 weeks  PLANNED INTERVENTIONS: 97110-Therapeutic exercises, 97530- Therapeutic activity, 97112- Neuromuscular re-education, 97535- Self Care, 11914- Manual therapy, Taping, Dry Needling, Joint mobilization, Joint manipulation, Spinal manipulation, Spinal mobilization, Scar mobilization, and Moist heat  PLAN FOR NEXT SESSION: cont exercises  Hildreth Orsak, PT 12/14/23 10:19 AM

## 2023-12-29 ENCOUNTER — Encounter: Admitting: Physical Therapy

## 2024-01-17 ENCOUNTER — Ambulatory Visit: Attending: Obstetrics and Gynecology | Admitting: Physical Therapy

## 2024-01-17 ENCOUNTER — Encounter: Payer: Self-pay | Admitting: Cardiology

## 2024-01-17 ENCOUNTER — Encounter: Payer: Self-pay | Admitting: Physical Therapy

## 2024-01-17 DIAGNOSIS — M62838 Other muscle spasm: Secondary | ICD-10-CM | POA: Diagnosis present

## 2024-01-17 DIAGNOSIS — M6281 Muscle weakness (generalized): Secondary | ICD-10-CM | POA: Diagnosis present

## 2024-01-17 DIAGNOSIS — R278 Other lack of coordination: Secondary | ICD-10-CM | POA: Diagnosis present

## 2024-01-17 NOTE — Therapy (Signed)
 OUTPATIENT PHYSICAL THERAPY FEMALE PELVIC TREATMENT/ reeval, POC update   Patient Name: Linda Braun MRN: [id number] DOB:Jan 25, 1951, 73 y.o., female Today's Date: 01/17/2024  END OF SESSION:  PT End of Session - 01/17/24 0856     Visit Number 6    Date for PT Re-Evaluation 02/08/24    Authorization Type MEDICARE PART A AND B    Authorization Time Period waiting on auth    PT Start Time 0850    PT Stop Time 0930    PT Time Calculation (min) 40 min    Activity Tolerance Patient tolerated treatment well    Behavior During Therapy [facility] for tasks assessed/performed                  Past Medical History:  Diagnosis Date   Anxiety    Meniscus tear    rt knee   Past Surgical History:  Procedure Laterality Date   acustic neuroma's removed     BONE FLAP RECONSTRUCTION SKULL     titanium plate   BREAST REDUCTION SURGERY     CESAREAN SECTION     x2   KNEE ARTHROSCOPY Right 10/03/2013   Procedure: RIGHT ARTHROSCOPY KNEE WITH DEBRIDEMENT;  Surgeon: Aurther Blue, MD;  Location: [facility];  Service: Orthopedics;  Laterality: Right;   TONSILLECTOMY     Patient Active Problem List   Diagnosis Date Noted   Medication management 02/26/2023   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) 02/26/2023   Acute medial meniscal tear 10/03/2013    PCP: Jearldine Mina, MD  REFERRING PROVIDER: Gretchen Leavell, MD  REFERRING DIAG: N94.10 (ICD-10-CM) - Unspecified dyspareunia   THERAPY DIAG:  Other muscle spasm  Muscle weakness (generalized)  Other lack of coordination  Rationale for Evaluation and Treatment: Rehabilitation  ONSET DATE: 2024- year ago  SUBJECTIVE:                                                                                                                                                                                           SUBJECTIVE STATEMENT: Pt reports that she just came from [place] . She is tired. Visited her 65 yo mom.  Her legs are  tired from statin.  Has not had intercourse again, muscle aches and is tired.  Has less urgency, forgot her estradiol when travelling, felt it.  Leaking is not as bad, tries to get to the bathroom quicker Gets up at night one time to pee Wants to take a nap, is tired. Does the wand a little bit Her goal for PT is to be checked on once/ moth to see if  her pain with intercourse is different.    Fluid intake: Yes: water , coffee, alcohol occasionally  - sparkling water  PAIN:  Are you having pain? No  PRECAUTIONS: None  RED FLAGS: None   WEIGHT BEARING RESTRICTIONS: No  FALLS:  Has patient fallen in last 6 months? No  LIVING ENVIRONMENT: Lives with: lives with their spouse Lives in: House/apartment Stairs: No Has following equipment at home: None  OCCUPATION: retired  PLOF: Independent  PATIENT GOALS: to have reduced urgency and less pain with intercourse  PERTINENT HISTORY:  acustic neuroma's removed      BONE FLAP RECONSTRUCTION SKULL   titanium plate   BREAST REDUCTION SURGERY      CESAREAN SECTION   x2   KNEE ARTHROSCOPY       Sexual abuse: No  BOWEL MOVEMENT: no issues   URINATION: Pain with urination: No Fully empty bladder: Yes:   Stream: Strong Urgency:yes Frequency: no Leakage:  running to the bathroom at times Pads: No  INTERCOURSE: Pain with intercourse: Initial Penetration Ability to have vaginal penetration:  Yes: with discomfort Climax: yes Marinoff Scale: 1/3  PREGNANCY: Vaginal deliveries 0 Tearing No C-section deliveries 2 Currently pregnant No  PROLAPSE: None   OBJECTIVE:  Note: Objective measures were completed at Evaluation unless otherwise noted.   COGNITION: Overall cognitive status: Within functional limits for tasks assessed     SENSATION: Light touch: Appears intact Proprioception: Appears intact  MUSCLE LENGTH: Hamstrings: Right 80 deg; Left 80 deg LUMBAR SPECIAL TESTS:  Straight leg raise test:  Negative  POSTURE: No Significant postural limitations  PELVIC ALIGNMENT: seems even  LUMBARAROM/PROM: grossly WFL   LOWER EXTREMITY ROM: some tightness throughout  LOWER EXTREMITY MMT: at least 4/5 throughout  PALPATION:   General  no tenderness C section scars                External Perineal Exam within functional limitations                              Internal Pelvic Floor tight and tender layer 1  Patient confirms identification and approves PT to assess internal pelvic floor and treatment Yes  PELVIC MMT:   MMT eval  Vaginal 3/5  Internal Anal Sphincter   External Anal Sphincter   Puborectalis   Diastasis Recti no  (Blank rows = not tested)        TONE: high  PROLAPSE: Anterior vaginal wall laxity in hooklying, not checked in standing today  TODAY'S TREATMENT:                                                                                                                              DATE: 01/17/24      Neuro reed- diaphragmatic breathing Ball press with transverse abdominis breath supine   There acts- Review of HEP and progress  PATIENT EDUCATION:  Education details: relevant anatomy, lubricants, pelvic wand, perineal massage education, vaginal estrogen superficially, bladder irritants Person educated: Patient Education method: Explanation, Demonstration, Tactile cues, Verbal cues, and Handouts Education comprehension: verbalized understanding and needs further education  HOME EXERCISE PROGRAM: [id number]  ASSESSMENT:  CLINICAL IMPRESSION: Pt tired today after her trip.  Pt will continue to benefit from PT. Reeval finished. Pt progressing well toward her goals.   OBJECTIVE IMPAIRMENTS: decreased ROM, decreased strength, increased muscle spasms, impaired tone, and pain.   ACTIVITY LIMITATIONS: continence and toileting  PARTICIPATION LIMITATIONS: interpersonal relationship and community activity  PERSONAL  FACTORS: Age and Time since onset of injury/illness/exacerbation are also affecting patient's functional outcome.   REHAB POTENTIAL: Good  CLINICAL DECISION MAKING: Stable/uncomplicated  EVALUATION COMPLEXITY: Low   GOALS: Goals reviewed with patient? Yes  SHORT TERM GOALS: Target date: 11/15/2023    Pt will be I with her initial HEP Baseline: Goal status: met 2.  Pt will be I with pelvic wand and perineal massage Baseline:  Goal status: met  3.  Pt will be I with healthy bladder habits Baseline:  Goal status: met  4.  Pt will reduce bladder irritants Baseline:  Goal status: progressing  LONG TERM GOALS: Target date: 12/13/2023 updated 12/14/2023- 02/08/2024      Pt will report no pain with intercourse Baseline:  Goal status: progressing  2.  Pt will soak 0 pads/ day Baseline:  Goal status: progressing  3.  Pt will report healthy bladder habits 100% of the time Baseline:  Goal status: INITIAL  4.  Pt will be I with her advanced HEP Baseline:  Goal status: progressing   PLAN:  PT FREQUENCY: 1-2x/week  PT DURATION: 8 weeks  PLANNED INTERVENTIONS: 97110-Therapeutic exercises, 97530- Therapeutic activity, 97112- Neuromuscular re-education, 97535- Self Care, [id number]- Manual therapy, Taping, Dry Needling, Joint mobilization, Joint manipulation, Spinal manipulation, Spinal mobilization, Scar mobilization, and Moist heat  PLAN FOR NEXT SESSION: cont exercises  Tatijana Bierly, PT 01/17/24 8:57 AM

## 2024-01-25 NOTE — Telephone Encounter (Signed)
 no referral listed for lipid clinic in chart

## 2024-01-30 ENCOUNTER — Ambulatory Visit (INDEPENDENT_AMBULATORY_CARE_PROVIDER_SITE_OTHER): Admitting: Podiatry

## 2024-01-30 ENCOUNTER — Encounter: Payer: Self-pay | Admitting: Podiatry

## 2024-01-30 DIAGNOSIS — L600 Ingrowing nail: Secondary | ICD-10-CM

## 2024-02-02 NOTE — Progress Notes (Signed)
 Subjective:   Patient ID: Linda Braun, female   DOB: 73 y.o.   MRN: [id number]   HPI Patient presents concerned about significant thickening of the right second digit nailbed for the last several months.  Does not remember specific injury does walk a lot and can traumatize this easily.  Does not smoke likes to be active   Review of Systems  All other systems reviewed and are negative.       Objective:  Physical Exam Vitals and nursing note reviewed.  Constitutional:      Appearance: She is well-developed.  Pulmonary:     Effort: Pulmonary effort is normal.  Musculoskeletal:        General: Normal range of motion.  Skin:    General: Skin is warm.  Neurological:     Mental Status: She is alert.     Neurovascular status intact muscle strength adequate range of motion within normal limits with patient noted to have a thickened deformed right second nail minimally discomforting with no other nail pathology disease currently     Assessment:  Probability for traumatized left second nailbed causing thickness and deformity of the bed     Plan:  H&P reviewed I discussed the damage to the nailbed but this does not appear to be fungal do not recommend oral medicines topical but may require nail removal which I educated her on today and at this point she will get pedicures and keep it under better control

## 2024-10-02 ENCOUNTER — Other Ambulatory Visit: Payer: Self-pay | Admitting: Cardiology
